# Patient Record
Sex: Male | Born: 1950 | Hispanic: Yes | Marital: Married | Smoking: Former smoker
Health system: Southern US, Community
[De-identification: ages and names within clinical notes are randomized; demographics above are authoritative.]

## PROBLEM LIST (undated history)

## (undated) DIAGNOSIS — M1990 Unspecified osteoarthritis, unspecified site: Secondary | ICD-10-CM

## (undated) DIAGNOSIS — E785 Hyperlipidemia, unspecified: Secondary | ICD-10-CM

## (undated) HISTORY — PX: KNEE SURGERY: SHX244

## (undated) HISTORY — DX: Hyperlipidemia, unspecified: E78.5

---

## 1978-07-01 HISTORY — PX: HEMORROIDECTOMY: SUR656

## 2014-01-19 ENCOUNTER — Telehealth: Payer: Self-pay | Admitting: Internal Medicine

## 2014-01-19 NOTE — Telephone Encounter (Signed)
Closed encounter °

## 2014-03-03 ENCOUNTER — Encounter: Payer: Self-pay | Admitting: Internal Medicine

## 2014-03-03 ENCOUNTER — Ambulatory Visit (INDEPENDENT_AMBULATORY_CARE_PROVIDER_SITE_OTHER): Payer: 59 | Admitting: Internal Medicine

## 2014-03-03 VITALS — BP 124/80 | HR 65 | Ht 70.0 in | Wt 192.1 lb

## 2014-03-03 DIAGNOSIS — E785 Hyperlipidemia, unspecified: Secondary | ICD-10-CM | POA: Insufficient documentation

## 2014-03-03 DIAGNOSIS — Z131 Encounter for screening for diabetes mellitus: Secondary | ICD-10-CM

## 2014-03-03 DIAGNOSIS — M5430 Sciatica, unspecified side: Secondary | ICD-10-CM | POA: Insufficient documentation

## 2014-03-03 DIAGNOSIS — R39198 Other difficulties with micturition: Secondary | ICD-10-CM

## 2014-03-03 DIAGNOSIS — Z79899 Other long term (current) drug therapy: Secondary | ICD-10-CM

## 2014-03-03 DIAGNOSIS — E782 Mixed hyperlipidemia: Secondary | ICD-10-CM

## 2014-03-03 DIAGNOSIS — Z8249 Family history of ischemic heart disease and other diseases of the circulatory system: Secondary | ICD-10-CM

## 2014-03-03 DIAGNOSIS — R3911 Hesitancy of micturition: Secondary | ICD-10-CM

## 2014-03-03 DIAGNOSIS — M5432 Sciatica, left side: Secondary | ICD-10-CM

## 2014-03-03 NOTE — Patient Instructions (Signed)
Dr Rennis Golden has ordered you to have blood work to be done FASTING.  Your physician wants you to follow-up in 6 months. You will receive a reminder letter in the mail one to two months in advance. If you don't receive a letter, please call our office to schedule the follow-up appointment.

## 2014-03-03 NOTE — Progress Notes (Signed)
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 He recently moved here full-time from [place]. He previously had a primary care provider there who put him on medication for his cholesterol. He does have a family history of premature coronary disease with his father who had coronary artery disease in his 69s and died of an MI. His mother had no significant heart disease. He's never had any prior history of coronary disease but has had stress tests in the past and has been told some of his symptoms are related to stress. He gets occasional sharp chest discomfort which goes away fairly quickly and does not sound anginal. He is actively exercising and swims walks or jogs and lifts weights 5-6 times a week. He is generally asymptomatic during this. He drinks a small amount of alcohol but is a nonsmoker.   PMHx:  Past Medical History  Diagnosis Date  . Hyperlipidemia     History reviewed. No pertinent past surgical history.  FAMHx:  Family History  Problem Relation Age of Onset  . Heart attack Father     SOCHx:   reports that he quit smoking about 6 months ago. His smoking use included Cigarettes. He has a 5 pack-year smoking history. He has never used smokeless tobacco. He reports that he drinks about 4 ounces of alcohol per week. He reports that he does not use illicit drugs.  ALLERGIES:  No Known Allergies  ROS: A comprehensive review of systems was negative except for: Musculoskeletal: positive for back pain and sciatica  HOME MEDS: Current Outpatient Prescriptions  Medication Sig Dispense Refill  . aspirin EC 81 MG tablet Take 81 mg by mouth daily.      Marland Kitchen atorvastatin (LIPITOR) 20 MG tablet Take 20 mg by mouth daily.       No current  facility-administered medications for this visit.    LABS/IMAGING: No results found for this or any previous visit (from the past 48 hour(s)). No results found.  VITALS: BP 124/80  Pulse 65  Ht  (1.778 m)  Wt 192 lb 1.6 oz (87.136 kg)  BMI 27.56 kg/m2  EXAM: General appearance: alert and no distress Neck: no carotid bruit, no JVD and thyroid not enlarged, symmetric, no tenderness/mass/nodules Lungs: clear to auscultation bilaterally Heart: regular rate and rhythm, S1, S2 normal, no murmur, click, rub or gallop Abdomen: soft, non-tender; bowel sounds normal; no masses,  no organomegaly Extremities: extremities normal, atraumatic, no cyanosis or edema Pulses: 2+ and symmetric Skin: Skin color, texture, turgor normal. No rashes or lesions Neurologic: Grossly normal Psych: Normal  EKG: Normal sinus rhythm at 65  ASSESSMENT: 1. Dyslipidemia 2. Family history of premature coronary disease 3. Sciatica  PLAN: 1.   Shaun Fleming is interested in establishing cardiac care base of his family history of heart disease as well as obtaining screening laboratory work and evaluating his generalized health. I would recommend a CBC, CMP, screening A1c, PSA and lipid profile. We will go ahead and renew his atorvastatin. It is reasonable for him to continue low-dose aspirin, based on his family history of coronary disease. He should continue his excellent exercise regimen. I will contact him with the results of his laboratory work. Have also provided him with the name of a primary care  provider in town which she may wish to establish. He is having some problems with low back pain and sciatica. He is contemplating possible back surgery in the future. He would likely need stress testing prior to this to determine his risk.  Plan to see him back in 6 months.  Chrystie Nose, MD, [facility] Attending Cardiologist [facility]  HILTY,Kenneth C 03/03/2014, 4:41 PM

## 2014-03-04 LAB — HEMOGLOBIN A1C
HEMOGLOBIN A1C: 5.6 % (ref <5.7)
Mean Plasma Glucose: 114 mg/dL (ref <117)

## 2014-03-04 LAB — CBC
HCT: 40.8 % (ref 39.0–52.0)
HEMOGLOBIN: 14.2 g/dL (ref 13.0–17.0)
MCH: 30.5 pg (ref 26.0–34.0)
MCHC: 34.8 g/dL (ref 30.0–36.0)
MCV: 87.6 fL (ref 78.0–100.0)
PLATELETS: 202 10*3/uL (ref 150–400)
RBC: 4.66 MIL/uL (ref 4.22–5.81)
RDW: 13.7 % (ref 11.5–15.5)
WBC: 9.1 10*3/uL (ref 4.0–10.5)

## 2014-03-05 LAB — COMPREHENSIVE METABOLIC PANEL
ALK PHOS: 66 U/L (ref 39–117)
ALT: 16 U/L (ref 0–53)
AST: 15 U/L (ref 0–37)
Albumin: 4.4 g/dL (ref 3.5–5.2)
BUN: 19 mg/dL (ref 6–23)
CO2: 28 meq/L (ref 19–32)
Calcium: 9.4 mg/dL (ref 8.4–10.5)
Chloride: 104 mEq/L (ref 96–112)
Creat: 1.07 mg/dL (ref 0.50–1.35)
GLUCOSE: 95 mg/dL (ref 70–99)
Potassium: 4.5 mEq/L (ref 3.5–5.3)
SODIUM: 141 meq/L (ref 135–145)
TOTAL PROTEIN: 6.8 g/dL (ref 6.0–8.3)
Total Bilirubin: 0.4 mg/dL (ref 0.2–1.2)

## 2014-03-05 LAB — PSA: PSA: 3.31 ng/mL (ref <=4.00)

## 2014-03-07 LAB — NMR LIPOPROFILE WITH LIPIDS
Cholesterol, Total: 170 mg/dL (ref 100–199)
HDL PARTICLE NUMBER: 37.8 umol/L (ref >=30.5)
HDL SIZE: 9.3 nm (ref >=9.2)
HDL-C: 56 mg/dL (ref >39)
LARGE HDL: 7.1 umol/L (ref >=4.8)
LARGE VLDL-P: 2.7 nmol/L (ref <=2.7)
LDL (calc): 97 mg/dL (ref 0–99)
LDL Particle Number: 1037 nmol/L — ABNORMAL HIGH (ref <1000)
LDL SIZE: 20.9 nm (ref >=20.8)
LP-IR Score: 44 (ref <=45)
Small LDL Particle Number: 462 nmol/L (ref <=527)
Triglycerides: 83 mg/dL (ref 0–149)
VLDL Size: 45.2 nm (ref <=46.6)

## 2014-03-08 ENCOUNTER — Encounter: Payer: Self-pay | Admitting: *Deleted

## 2014-04-06 ENCOUNTER — Other Ambulatory Visit: Payer: Self-pay | Admitting: *Deleted

## 2014-04-06 ENCOUNTER — Telehealth: Payer: Self-pay | Admitting: Internal Medicine

## 2014-04-06 MED ORDER — ATORVASTATIN CALCIUM 20 MG PO TABS: 20.0000 mg | ORAL_TABLET | Freq: Every day | ORAL | Status: DC

## 2014-04-06 NOTE — Telephone Encounter (Signed)
atorvastatin (LIPITOR) 20 MG tablet #30 tablet with 10 refills on 04/06/2014  Sig - Route: Take 1 tablet (20 mg total) by mouth daily. - Oral  E-Prescribing Status: Receipt confirmed by pharmacy (04/06/2014 10:10 AM EDT)

## 2014-04-06 NOTE — Telephone Encounter (Signed)
Please call his Atorvastatin 20 mg #30 for 6 months. He needs 6 months because [place] is going [place] the [place].Please call to [phone].

## 2014-04-06 NOTE — Telephone Encounter (Signed)
Rx was sent to pharmacy electronically. 

## 2014-04-29 ENCOUNTER — Other Ambulatory Visit: Payer: Self-pay

## 2014-04-29 MED ORDER — ATORVASTATIN CALCIUM 20 MG PO TABS: 20.0000 mg | ORAL_TABLET | Freq: Every day | ORAL | Status: DC

## 2014-04-29 NOTE — Telephone Encounter (Signed)
Rx sent to pharmacy   

## 2015-02-12 ENCOUNTER — Other Ambulatory Visit: Payer: Self-pay | Admitting: Internal Medicine

## 2015-02-13 NOTE — Telephone Encounter (Signed)
REFILL 

## 2015-03-26 ENCOUNTER — Encounter (HOSPITAL_COMMUNITY): Payer: Self-pay | Admitting: Emergency Medicine

## 2015-03-26 ENCOUNTER — Emergency Department (HOSPITAL_COMMUNITY)
Admission: EM | Admit: 2015-03-26 | Discharge: 2015-03-27 | Disposition: A | Discharge status: Home / Self Care | Payer: 59 | Attending: Emergency Medicine | Admitting: Emergency Medicine

## 2015-03-26 DIAGNOSIS — N390 Urinary tract infection, site not specified: Secondary | ICD-10-CM

## 2015-03-26 DIAGNOSIS — Z79899 Other long term (current) drug therapy: Secondary | ICD-10-CM | POA: Diagnosis not present

## 2015-03-26 DIAGNOSIS — Z7982 Long term (current) use of aspirin: Secondary | ICD-10-CM | POA: Diagnosis not present

## 2015-03-26 DIAGNOSIS — Z87891 Personal history of nicotine dependence: Secondary | ICD-10-CM | POA: Diagnosis not present

## 2015-03-26 DIAGNOSIS — E785 Hyperlipidemia, unspecified: Secondary | ICD-10-CM | POA: Diagnosis not present

## 2015-03-26 DIAGNOSIS — R509 Fever, unspecified: Secondary | ICD-10-CM | POA: Diagnosis present

## 2015-03-26 LAB — URINALYSIS, ROUTINE W REFLEX MICROSCOPIC
Bilirubin Urine: NEGATIVE
GLUCOSE, UA: NEGATIVE mg/dL
Ketones, ur: NEGATIVE mg/dL
Nitrite: NEGATIVE
PH: 8 (ref 5.0–8.0)
PROTEIN: 100 mg/dL — AB
SPECIFIC GRAVITY, URINE: 1.018 (ref 1.005–1.030)
Urobilinogen, UA: 1 mg/dL (ref 0.0–1.0)

## 2015-03-26 LAB — CBC WITH DIFFERENTIAL/PLATELET
BASOS ABS: 0 10*3/uL (ref 0.0–0.1)
Basophils Relative: 0 %
Eosinophils Absolute: 0 10*3/uL (ref 0.0–0.7)
Eosinophils Relative: 0 %
HEMATOCRIT: 43.3 % (ref 39.0–52.0)
Hemoglobin: 14.8 g/dL (ref 13.0–17.0)
LYMPHS PCT: 7 %
Lymphs Abs: 1.1 10*3/uL (ref 0.7–4.0)
MCH: 30.9 pg (ref 26.0–34.0)
MCHC: 34.2 g/dL (ref 30.0–36.0)
MCV: 90.4 fL (ref 78.0–100.0)
MONO ABS: 0.8 10*3/uL (ref 0.1–1.0)
Monocytes Relative: 5 %
NEUTROS ABS: 14.6 10*3/uL — AB (ref 1.7–7.7)
Neutrophils Relative %: 88 %
Platelets: 189 10*3/uL (ref 150–400)
RBC: 4.79 MIL/uL (ref 4.22–5.81)
RDW: 12.8 % (ref 11.5–15.5)
WBC: 16.5 10*3/uL — ABNORMAL HIGH (ref 4.0–10.5)

## 2015-03-26 LAB — COMPREHENSIVE METABOLIC PANEL
ALBUMIN: 4.7 g/dL (ref 3.5–5.0)
ALT: 20 U/L (ref 17–63)
AST: 23 U/L (ref 15–41)
Alkaline Phosphatase: 66 U/L (ref 38–126)
Anion gap: 6 (ref 5–15)
BUN: 19 mg/dL (ref 6–20)
CHLORIDE: 105 mmol/L (ref 101–111)
CO2: 28 mmol/L (ref 22–32)
Calcium: 9.2 mg/dL (ref 8.9–10.3)
Creatinine, Ser: 1.26 mg/dL — ABNORMAL HIGH (ref 0.61–1.24)
GFR calc Af Amer: >60 mL/min (ref >60)
GFR calc non Af Amer: 58 mL/min — ABNORMAL LOW (ref >60)
GLUCOSE: 104 mg/dL — AB (ref 65–99)
POTASSIUM: 4.4 mmol/L (ref 3.5–5.1)
Sodium: 139 mmol/L (ref 135–145)
Total Bilirubin: 0.5 mg/dL (ref 0.3–1.2)
Total Protein: 7.6 g/dL (ref 6.5–8.1)

## 2015-03-26 LAB — I-STAT CG4 LACTIC ACID, ED: Lactic Acid, Venous: 1.33 mmol/L (ref 0.5–2.0)

## 2015-03-26 LAB — URINE MICROSCOPIC-ADD ON

## 2015-03-26 MED ORDER — DEXTROSE 5 % IV SOLN
1.0000 g | Freq: Once | INTRAVENOUS | Status: AC
  Administered 2015-03-26: 1 g via INTRAVENOUS
  Filled 2015-03-26: qty 10

## 2015-03-26 MED ORDER — SODIUM CHLORIDE 0.9 % IV BOLUS (SEPSIS)
1000.0000 mL | Freq: Once | INTRAVENOUS | Status: AC
  Administered 2015-03-26: 1000 mL via INTRAVENOUS

## 2015-03-26 MED ORDER — ACETAMINOPHEN 325 MG PO TABS
650.0000 mg | ORAL_TABLET | Freq: Once | ORAL | Status: AC | PRN
  Administered 2015-03-26: 650 mg via ORAL
  Filled 2015-03-26: qty 2

## 2015-03-26 NOTE — ED Notes (Signed)
Delay in lab draw pt in bathroom 

## 2015-03-26 NOTE — ED Notes (Signed)
Pt states that he started having chills today, has had to urinate frequently and has a fever. Alert and oriented.

## 2015-03-26 NOTE — ED Provider Notes (Signed)
CSN: [id number]     Arrival date & time 03/26/15  2150 History   First MD Initiated Contact with Patient 03/26/15 2243     Chief Complaint  Patient presents with  . Polyuria  . Fever     (Consider location/radiation/quality/duration/timing/severity/associated sxs/prior Treatment) HPI Comments: Patient with no pertinent past medical history presents to the emergency department with chief complaint of dysuria and urinary frequency. He reports associated fevers, and chills that started today. He states that he just noticed some associated hematuria. He has not tried taking anything to alleviate his symptoms. There are no aggravating or alleviating factors. He has never had anything like this before. There are no radiating symptoms. Patient denies any abdominal pain.  The history is provided by the patient. No language interpreter was used.    Past Medical History  Diagnosis Date  . Hyperlipidemia    History reviewed. No pertinent past surgical history. Family History  Problem Relation Age of Onset  . Heart attack Father    Social History  Substance Use Topics  . Smoking status: Former Smoker -- 0.25 packs/day for 20 years    Types: Cigarettes    Quit date: 08/31/2013  . Smokeless tobacco: Never Used  . Alcohol Use: 4.0 oz/week    8 drink(s) per week    Review of Systems  Constitutional: Negative for fever and chills.  Respiratory: Negative for shortness of breath.   Cardiovascular: Negative for chest pain.  Gastrointestinal: Negative for nausea, vomiting, diarrhea and constipation.  Genitourinary: Positive for dysuria and hematuria.  All other systems reviewed and are negative.     Allergies  Review of patient's allergies indicates no known allergies.  Home Medications   Prior to Admission medications   Medication Sig Start Date End Date Taking? Authorizing Provider  aspirin EC 81 MG tablet Take 81 mg by mouth daily.    Historical Provider, MD  atorvastatin  (LIPITOR) 20 MG tablet Take 1 tablet (20 mg total) by mouth daily at 6 PM. NEED OV. 02/13/15   Chrystie Nose, MD   BP 145/73 mmHg  Pulse 122  Temp(Src) 101 F (38.3 C) (Oral)  Resp 15  Ht  (1.778 m)  Wt 182 lb (82.555 kg)  BMI 26.11 kg/m2  SpO2 100% Physical Exam  Constitutional: He is oriented to person, place, and time. He appears well-developed and well-nourished.  HENT:  Head: Normocephalic and atraumatic.  Eyes: Conjunctivae and EOM are normal. Pupils are equal, round, and reactive to light. Right eye exhibits no discharge. Left eye exhibits no discharge. No scleral icterus.  Neck: Normal range of motion. Neck supple. No JVD present.  Cardiovascular: Normal rate, regular rhythm and normal heart sounds.  Exam reveals no gallop and no friction rub.   No murmur heard. Pulmonary/Chest: Effort normal and breath sounds normal. No respiratory distress. He has no wheezes. He has no rales. He exhibits no tenderness.  Abdominal: Soft. He exhibits no distension and no mass. There is tenderness. There is no rebound and no guarding.  Mild suprapubic tenderness, no other focal abdominal tenderness  Musculoskeletal: Normal range of motion. He exhibits no edema or tenderness.  Neurological: He is alert and oriented to person, place, and time.  Skin: Skin is warm and dry.  Psychiatric: He has a normal mood and affect. His behavior is normal. Judgment and thought content normal.  Nursing note and vitals reviewed.   ED Course  Procedures (including critical care time) Results for orders placed or performed during  the hospital encounter of 03/26/15  Comprehensive metabolic panel  Result Value Ref Range   Sodium 139 135 - 145 mmol/L   Potassium 4.4 3.5 - 5.1 mmol/L   Chloride 105 101 - 111 mmol/L   CO2 28 22 - 32 mmol/L   Glucose, Bld 104 (H) 65 - 99 mg/dL   BUN 19 6 - 20 mg/dL   Creatinine, Ser 1.61 (H) 0.61 - 1.24 mg/dL   Calcium 9.2 8.9 - 09.6 mg/dL   Total Protein 7.6 6.5 - 8.1  g/dL   Albumin 4.7 3.5 - 5.0 g/dL   AST 23 15 - 41 U/L   ALT 20 17 - 63 U/L   Alkaline Phosphatase 66 38 - 126 U/L   Total Bilirubin 0.5 0.3 - 1.2 mg/dL   GFR calc non Af Amer 58 (L) >60 mL/min   GFR calc Af Amer >60 >60 mL/min   Anion gap 6 5 - 15  Urinalysis, Routine w reflex microscopic (not at [facility])  Result Value Ref Range   Color, Urine YELLOW YELLOW   APPearance TURBID (A) CLEAR   Specific Gravity, Urine 1.018 1.005 - 1.030   pH 8.0 5.0 - 8.0   Glucose, UA NEGATIVE NEGATIVE mg/dL   Hgb urine dipstick LARGE (A) NEGATIVE   Bilirubin Urine NEGATIVE NEGATIVE   Ketones, ur NEGATIVE NEGATIVE mg/dL   Protein, ur 045 (A) NEGATIVE mg/dL   Urobilinogen, UA 1.0 0.0 - 1.0 mg/dL   Nitrite NEGATIVE NEGATIVE   Leukocytes, UA LARGE (A) NEGATIVE  CBC with Differential  Result Value Ref Range   WBC 16.5 (H) 4.0 - 10.5 K/uL   RBC 4.79 4.22 - 5.81 MIL/uL   Hemoglobin 14.8 13.0 - 17.0 g/dL   HCT 40.9 81.1 - 91.4 %   MCV 90.4 78.0 - 100.0 fL   MCH 30.9 26.0 - 34.0 pg   MCHC 34.2 30.0 - 36.0 g/dL   RDW 78.2 95.6 - 21.3 %   Platelets 189 150 - 400 K/uL   Neutrophils Relative % 88 %   Neutro Abs 14.6 (H) 1.7 - 7.7 K/uL   Lymphocytes Relative 7 %   Lymphs Abs 1.1 0.7 - 4.0 K/uL   Monocytes Relative 5 %   Monocytes Absolute 0.8 0.1 - 1.0 K/uL   Eosinophils Relative 0 %   Eosinophils Absolute 0.0 0.0 - 0.7 K/uL   Basophils Relative 0 %   Basophils Absolute 0.0 0.0 - 0.1 K/uL  Urine microscopic-add on  Result Value Ref Range   Urine-Other FIELD OBSCURED BY RBC'S   I-Stat CG4 Lactic Acid, ED  (not at [facility])  Result Value Ref Range   Lactic Acid, Venous 1.33 0.5 - 2.0 mmol/L   No results found.    MDM   Final diagnoses:  UTI (lower urinary tract infection)    Patient with dysuria, hematuria, fevers, chills, and frequency.  Symptoms consistent with UTI, possible early pyelo.  Will treat with fluids and rocephin.  Urine culture pending.  No other pertinent past medical  problems.  Laboratory tests remarkable for leukocytosis to 16.5, creatinine is mildly elevated at 1.26. Lactic acid is normal at 1.33. Urinalysis is remarkable for large amount of hemoglobin and large leukocytes. Microscopic exam is limited because the field is obscured by RBCs. Patient's symptoms however consistent with urinary tract infection. Will treat with Rocephin and fluids in the emergency department.  Patient is feeling much better. Will discharge to home. Vital signs are stable. Will discharge with Keflex. Urine culture pending.  Specific return precautions given. Patient understands and agrees with plan.  Patient discussed with Dr. Rhunette Croft.    Roxy Horseman, PA-C 03/27/15 0030  Derwood Kaplan, MD 03/28/15 [phone]

## 2015-03-27 MED ORDER — CEPHALEXIN 500 MG PO CAPS: 500.0000 mg | ORAL_CAPSULE | Freq: Four times a day (QID) | ORAL | Status: DC

## 2015-03-27 NOTE — Discharge Instructions (Signed)

## 2015-03-29 LAB — URINE CULTURE

## 2015-03-30 ENCOUNTER — Telehealth (HOSPITAL_BASED_OUTPATIENT_CLINIC_OR_DEPARTMENT_OTHER): Payer: Self-pay | Admitting: Emergency Medicine

## 2015-03-30 NOTE — Telephone Encounter (Signed)
Post ED Visit - Positive Culture Follow-up  Culture report reviewed by antimicrobial stewardship pharmacist:   Celedonio Miyamoto, Pharm.D., BCPS  Georgina Pillion, Pharm.D., BCPS  [place], [place].D., BCPS, AAHIVP  Estella Husk, Pharm.D., BCPS, AAHIVP  [employer], [address].D.  Cassie Roseanne Ren[place], [place].D.  Positive urine culture E. coli Treated with cephalexin, organism sensitive to the same and no further patient follow-up is required at this time.  Berle Mull 03/30/2015, 10:01 AM

## 2015-05-02 ENCOUNTER — Other Ambulatory Visit (HOSPITAL_COMMUNITY): Payer: Self-pay | Admitting: Neurosurgery

## 2015-05-21 ENCOUNTER — Encounter (HOSPITAL_COMMUNITY): Payer: Self-pay | Admitting: Neurosurgery

## 2015-05-21 NOTE — H&P (Signed)
Patient ID:   [id number] Patient: Shaun Fleming  Date of Birth: February 18, 1951 Visit Type: Office Visit   Date: 04/27/2015 02:00 PM Provider: Marchia Meiers. Vertell Limber MD   This 64 year old male presents for back pain.  History of Present Illness: 1.  back pain  I met with the patient and his wife and reviewed his imaging findings with them.  I reviewed scoliosis radiographs.  There is coronal malalignment with a Cobb angle of 20 but he does not appear to have significant deviation from acceptable sagittal balance.  Given these findings and his complaints of radiculopathy I recommended proceeding with L3 L4, L4-L5, L5-S1 decompression and fusion surgery.  There is some coronal malalignment at L1-2 and L2-3 levels but these are not causing significant nerve root compression and he is not complaining of a lot of back pain.  I told him I did not think that surgery.  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  I have therefore recommended proceeding with decompression and fusion L3 through S1 levels.  We discussed the surgery in detail and then went over specific expectations and surgical models.  Aaron Edelman answered his questions and 50 him for LSO brace.  We're planning on scheduling surgery in the latter part of November.      Medical/Surgical/Interim History Reviewed, no change.  Last detailed document date:04/13/2015.   PAST MEDICAL HISTORY, SURGICAL HISTORY, FAMILY HISTORY, SOCIAL HISTORY AND REVIEW OF SYSTEMS I have reviewed the patient's past medical, surgical, family and social history as well as the comprehensive review of systems as included on the [facility] history form dated 04/13/2015, which I have signed.  Family History: Reviewed, no changes.  Last detailed document: 04/13/2015.   Social History: Tobacco use reviewed. Reviewed, no changes. Last detailed document date: 04/13/2015.       MEDICATIONS(added, continued or stopped this visit): Started Medication Directions Instruction Stopped   aspirin 81 mg tablet,delayed release take 1 tablet by oral route  every day     atorvastatin 20 mg tablet take 1 tablet by oral route  every day       ALLERGIES: Ingredient Reaction Medication Name Comment  NO KNOWN ALLERGIES     No known allergies.    Vitals Date Temp F BP Pulse Ht In Wt Lb BMI BSA Pain Score  04/27/2015  132/89 60 70 188 26.97  2/10      IMPRESSION The patient continues to have significant left greater than right lower extremity weakness and pain and back pain as well.  He is not responding to conservative management.  He wishes to proceed with surgery.  This will consist of L3 through S1 decompression and fusion surgery.  Completed Orders (this encounter) Order Details Reason Side Interpretation Result Initial Treatment Date Region  Scoliosis- AP/Lat      04/27/2015    Assessment/Plan # Detail Type Description   1. Assessment Spinal stenosis of lumbosacral region (M48.07).       2. Assessment Lumbago with sciatica, left side (M54.42).       3. Assessment Spondylolisthesis at L3-L4 level (M43.16).       4. Assessment Scoliosis (and kyphoscoliosis), idiopathic (M41.20).       5. Assessment Spinal stenosis, lumbar region (M48.06).         Pain Assessment/Treatment Pain Scale: 2/10. Method: Numeric Pain Intensity Scale. Location: back. Onset: 11/29/2009. Duration: varies. Quality: discomforting. Pain Assessment/Treatment follow-up plan of care: Patient alternating  heat and ice..  Fall Risk Plan The patient has not fallen in the last year.  Surgery is being scheduled in the latter part of November.  The patient was fitted for now also brace today.  We answered his questions and went over teaching in great detail.  Orders: Diagnostic Procedures: Assessment Procedure  M48.06 MAS PLIF  - L3-L4 - L4-L5 - L5-S1  M48.06 Scoliosis-  AP/Lat             Provider:  Marchia Meiers. Vertell Limber MD  04/29/2015 04:17 PM Dictation edited by: Marchia Meiers. [facility]    CC Providers: Velna Hatchet [address] [place], [place] [address]              Electronically signed by Marchia Meiers. Vertell Limber MD on 04/29/2015 04:17 PM  Patient ID:   [id number] Patient: Shaun Fleming  Date of Birth: 10-24-1950 Visit Type: Office Visit   Date: 04/13/2015 02:45 PM Provider: Marchia Meiers. Vertell Limber MD   This 64 year old male presents for back pain.  History of Present Illness: 1.  back pain  The patient comes in today for evaluation of left leg pain and increasingly worsening pain and weakness involving his left leg.  He now says that he cannot stand or walk for more than 100 feet and he complains of severe sciatica on the left leg.  Previous MRI of his lumbar spine was done about a year ago and he feels that his symptoms have progressed significantly.  His family had requested that he see me.  He had previously seen Dr. Kathyrn Sheriff.  Dr. Kathyrn Sheriff had recommended a lumbar decompression and fusion surgery involving the L3 L4 and L4 L5 levels because of spondylolisthesis and severe stenosis at these levels.  On my review of the MRI of the lumbar spine I am struck by the amount of foraminal stenosis at the L5-S1 level on the left.  I do think this is significant and I think this also plays into his left leg complaints which involve radiation into the top of his left foot.  On examination today he has extensor hallucis longus weakness at 4 out of 5 and also left hip abductor weakness at 4 out of 5.  He has decreased pain sensation in the left L5 distribution and positive straight leg raise on the left.  He is left sciatic notch discomfort to palpation.        PAST MEDICAL/SURGICAL HISTORY   (Detailed)  Disease/disorder Onset Date Management Date Comments    Arthroscopy knee  CRR 12/19/2014 - Left knee  Arthritis      High cholesterol    CRR  12/19/2014 -     PAST MEDICAL HISTORY, SURGICAL HISTORY, FAMILY HISTORY, SOCIAL HISTORY AND REVIEW OF SYSTEMS I have reviewed the patient's past medical, surgical, family and social history as well as the comprehensive review of systems as included on the [facility] history form dated 04/13/2015, which I have signed.  Family History  (Detailed) Patient reports there is no relevant family history.    SOCIAL HISTORY  (Detailed) Tobacco use reviewed. Preferred language is Unknown.   Smoking status: Never smoker.  SMOKING STATUS Use Status Type Smoking Status Usage Per Day Years Used Total Pack Years  no/never  Never smoker       HOME ENVIRONMENT/SAFETY The patient has not fallen in the last year.        MEDICATIONS(added, continued or stopped this visit): Started Medication Directions Instruction Stopped   aspirin  81 mg tablet,delayed release take 1 tablet by oral route  every day     atorvastatin 20 mg tablet take 1 tablet by oral route  every day       ALLERGIES: Ingredient Reaction Medication Name Comment  NO KNOWN ALLERGIES     No known allergies.    Vitals Date Temp F BP Pulse Ht In Wt Lb BMI BSA Pain Score  04/13/2015  122/76 76 70 183 26.26  4/10      IMPRESSION At this point the patient's symptoms have progressed and he has developed increasing left leg weakness.  I do think that further evaluation with MR imaging needs to be performed to make sure that there is not been progression of degenerative changes and spondylolisthesis as well as significant foraminal stenosis on the left at the L5-S1 level.  To this and I recommended a repeat lumbar radiographs and repeat lumbar MRI.  Completed Orders (this encounter) Order Details Reason Side Interpretation Result Initial Treatment Date Region  Lumbar Spine- AP/Lat/Flex/Ex      04/13/2015   Lifestyle education regarding diet Encouraged to eat a well balanced diet and follow up with  primary care physician.         Assessment/Plan # Detail Type Description   1. Assessment Spondylolisthesis at L3-L4 level (M43.16).       2. Assessment Spinal stenosis of lumbar region (M48.06).       3. Assessment Spinal stenosis of lumbosacral region (M48.07).       4. Assessment Lumbago with sciatica, left side (M54.42).       5. Assessment Body mass index (BMI) 26.0-26.9, adult (W97.94).   Plan Orders Today's instructions / counseling include(s) Lifestyle education regarding diet.         Pain Assessment/Treatment Pain Scale: 4/10. Method: Numeric Pain Intensity Scale. Location: back. Onset: 11/29/2009. Duration: varies. Quality: discomforting. Pain Assessment/Treatment follow-up plan of care: Patient is taking medications as prescribed..  Fall Risk Plan The patient has not fallen in the last year.  I do think that the patient will require surgery although I'm not certain at this point of the specific levels.  He may need attention to the L5-S1 level if indeed it appears that his left L5 nerve root is getting significant compression within the foramen due to stenosis and spondylolisthesis as well as mild scoliosis.  I agree with the L3 L4 and L4-L5 levels are also significantly affected.  The patient has gone through considerable conservative management without relief.  He is concerned about his progressive weakness.  Orders: Diagnostic Procedures: Assessment Procedure  M54.42 Lumbar Spine- AP/Lat/Flex/Ex  M54.42 MRI Spine/lumb W/o Contrast  Instruction(s)/Education: Assessment Instruction  Z68.26 Lifestyle education regarding diet             Provider:  Marchia Meiers. Vertell Limber MD  04/13/2015 05:02 PM Dictation edited by: Marchia Meiers. [facility]    CC Providers: Velna Hatchet [address] [place], [place] [address]              Electronically signed by Marchia Meiers. Vertell Limber MD on 04/13/2015 05:02 PM

## 2015-05-23 ENCOUNTER — Encounter (HOSPITAL_COMMUNITY): Payer: Self-pay

## 2015-05-23 ENCOUNTER — Telehealth: Payer: Self-pay | Admitting: Internal Medicine

## 2015-05-23 ENCOUNTER — Encounter (HOSPITAL_COMMUNITY)
Admission: RE | Admit: 2015-05-23 | Discharge: 2015-05-23 | Disposition: A | Discharge status: Home / Self Care | Payer: 59 | Source: Ambulatory Visit | Attending: Neurosurgery | Admitting: Neurosurgery

## 2015-05-23 DIAGNOSIS — Z79899 Other long term (current) drug therapy: Secondary | ICD-10-CM | POA: Insufficient documentation

## 2015-05-23 DIAGNOSIS — Z01812 Encounter for preprocedural laboratory examination: Secondary | ICD-10-CM | POA: Diagnosis not present

## 2015-05-23 DIAGNOSIS — Z0183 Encounter for blood typing: Secondary | ICD-10-CM | POA: Insufficient documentation

## 2015-05-23 DIAGNOSIS — Z01818 Encounter for other preprocedural examination: Secondary | ICD-10-CM | POA: Insufficient documentation

## 2015-05-23 DIAGNOSIS — Z7982 Long term (current) use of aspirin: Secondary | ICD-10-CM | POA: Insufficient documentation

## 2015-05-23 DIAGNOSIS — E785 Hyperlipidemia, unspecified: Secondary | ICD-10-CM | POA: Insufficient documentation

## 2015-05-23 DIAGNOSIS — Z87891 Personal history of nicotine dependence: Secondary | ICD-10-CM | POA: Diagnosis not present

## 2015-05-23 HISTORY — DX: Unspecified osteoarthritis, unspecified site: M19.90

## 2015-05-23 LAB — TYPE AND SCREEN
ABO/RH(D): A POS
ANTIBODY SCREEN: NEGATIVE

## 2015-05-23 LAB — ABO/RH: ABO/RH(D): A POS

## 2015-05-23 LAB — BASIC METABOLIC PANEL
ANION GAP: 7 (ref 5–15)
BUN: 12 mg/dL (ref 6–20)
CALCIUM: 9.1 mg/dL (ref 8.9–10.3)
CO2: 26 mmol/L (ref 22–32)
Chloride: 106 mmol/L (ref 101–111)
Creatinine, Ser: 1.2 mg/dL (ref 0.61–1.24)
Glucose, Bld: 102 mg/dL — ABNORMAL HIGH (ref 65–99)
Potassium: 4.2 mmol/L (ref 3.5–5.1)
SODIUM: 139 mmol/L (ref 135–145)

## 2015-05-23 LAB — URINALYSIS, ROUTINE W REFLEX MICROSCOPIC
BILIRUBIN URINE: NEGATIVE
GLUCOSE, UA: NEGATIVE mg/dL
HGB URINE DIPSTICK: NEGATIVE
KETONES UR: NEGATIVE mg/dL
Leukocytes, UA: NEGATIVE
Nitrite: NEGATIVE
PROTEIN: NEGATIVE mg/dL
Specific Gravity, Urine: 1.016 (ref 1.005–1.030)
pH: 7.5 (ref 5.0–8.0)

## 2015-05-23 LAB — CBC
HCT: 39.1 % (ref 39.0–52.0)
HEMOGLOBIN: 13.2 g/dL (ref 13.0–17.0)
MCH: 30.3 pg (ref 26.0–34.0)
MCHC: 33.8 g/dL (ref 30.0–36.0)
MCV: 89.7 fL (ref 78.0–100.0)
Platelets: 192 10*3/uL (ref 150–400)
RBC: 4.36 MIL/uL (ref 4.22–5.81)
RDW: 13.2 % (ref 11.5–15.5)
WBC: 6.1 10*3/uL (ref 4.0–10.5)

## 2015-05-23 LAB — SURGICAL PCR SCREEN
MRSA, PCR: NEGATIVE
Staphylococcus aureus: NEGATIVE

## 2015-05-23 NOTE — Pre-Procedure Instructions (Signed)
    Shaun Fleming  05/23/2015      Your procedure is scheduled on Tuesday, November 29..  Report to [facility] Admitting at 10:00 A.M.               Your procedure is scheduled for 12:00 noon   Call this number if you have problems the morning of surgery:[phone]                For any other questions, please call [phone], Monday - Friday 8 AM - 4 PM.   Remember:  Do not eat food or drink liquids after midnight Monday, November 28.  Take these medicines the morning of surgery with A SIP OF WATER : None   Do not wear jewelry, make-up or nail polish.   Do not wear lotions, powders, or perfumes.     Men may shave face and neck.   Do not bring valuables to the hospital.   [facility] is not responsible for any belongings or valuables.  Contacts, dentures or bridgework may not be worn into surgery.  Leave your suitcase in the car.  After surgery it may be brought to your room.  For patients admitted to the hospital, discharge time will be determined by your treatment team.  Special instructions:  Review  [facility] - Preparing For Surgery.  Please read over the following fact sheets that you were given. Pain Booklet, Coughing and Deep Breathing, Blood Transfusion Information and Surgical Site Infection Prevention

## 2015-05-23 NOTE — Progress Notes (Signed)
Anesthesia Note: Patient is a 64 year old male scheduled for L3-4, L4-5, L5-S1 MAS PLIF on 05/30/15 by Dr. Venetia MaxonStern.   History includes former smoker, HLD. Question family history of premature CAD. He says father had chest pain in his 4350's, but definite CAD/MI. [place] working [place] then, and his parents were in [place]. He says his father never underwent any intervention and died in his 7580's of "natural causes". His mother died at age 66 also from natural causes.    [place]. 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   PCP is Dr. Christiane HaSharon Wolter with [facility]. Last CPE with EKG on 04/10/15.      [place], he saw cardiologist Dr. Zoila ShutterKenneth Hilty in 03/2014. He had started his new job that was very stressful and just wanted to get checked out. Per Dr. Blanchie DessertHilty's note, patient had prior stress tests and was told symptoms were related to stress. Patient tells me prior stress test was done as part of a "routine check up" and were okay. At that visit, he did report occasional sharp chest pains that Dr. Rennis GoldenHilty did not think were anginal. He was contemplating possible back surgery, and Dr. Rennis GoldenHilty felt patient "would likely need stress testing prior to this to determine his risk.." Patient says that since then, he has no further sharp or other type of chest pains. No SOB, new edema. He stays very active at work and goes to the gym "nearly every day." [facility] for 1-1 hr 15 min. He does 45 minutes of cardio (bike-level 7 for 10 minutes and then advances to 02-06-09). He does not get chest pains with this level of activity.   Meds list includes ASA 81 mg, Lipitor.  PAT Vitals: BP 127/80, HR 65, RR 20, T 36.4, O2 sat 99%. Heart RRR, no murmur noted. Lungs clear. No carotid bruits noted. Good mouth opening. No significant LE edema.  04/10/15 EKG  (Dr. Paulino RilyWolters): NSR, RSR prime in V1, non-diagnostic. I and Dr. Aleene DavidsonE. Fitzgerald reviewed tracing and both felt it was not significantly changed when compared to prior tracing from 03/02/14.  Preoperative labs noted. CBC, BMET (glucose 102 but non-fasting), UA WNL.   I reviewed above with anesthesiologist Dr. Aleene DavidsonE. Fitzgerald. Recommend getting preoperative cardiology input since Dr. Rennis GoldenHilty was considering pre-operative stress test at his visit last year. I called and spoke with Dr. Rennis GoldenHilty. We discussed patient's plans for lumbar fusion, his current exercise tolerance, denial of CV symptoms. (At that time, we were still awaiting EKG from PCP.) We discussed whether patient would need to be seen pre-operatively by cardiology versus getting a pre-operative stress test versus proceeding as planned since patient has no CV symptoms and METS > 4. He felt that if the EKG was stable then patient, "could likely have surgery with an acceptable risk and no further testing. I'm happy to see him as needed during the hospitalization." As above, I and Dr. Sampson GoonFitzgerald felt his EKG was stable. If no acute changes then would plan to proceed.   Velna Ochsllison Roper Tolson, PA-C [facility]/Anesthesiology Phone [phone] 05/23/2015 5:14 PM

## 2015-05-23 NOTE — Progress Notes (Signed)
Dr Rennis GoldenHilty notes, 03/03/14, "He is having some problems with low back pain and sciatica. He is contemplating possible back surgery in the future. He would likely need stress testing prior to this to determine his risk."  Also requested to see Patient in 6 months.  I do not see that he has seen Dr Rennis GoldenHilty since 03/04/15. I also noted that patient was treated in ED 03/2105 for UTI- E-coli.  I called and gave this information to Shanda BumpsJessica at Dr Fredrich BirksStern's office.  Shanda BumpsJessica said she will send clearance note to Dr Rennis GoldenHilty and we will do a urinalysis at PAT appointment.

## 2015-05-23 NOTE — Telephone Encounter (Signed)
Called by Shonna ChockAllison Zelenak, PA-C with anesthesia pre-op. Mr. Shaun Fleming is scheduled to have back surgery soon. When I saw him last in 2015, he was having some chest pains (albeit atypical) and given his family history of CAD and lack of significant exertion, I mentioned it may be helpful to consider stress testing prior to surgery. At this point, he reports that he is more active, denies any chest pain and is ready to have back surgery. I would certainly review a repeat EKG - if there are no changes, he could likely have surgery with an acceptable risk and no further testing. I'm happy to see him as needed during the hospitalization.  Thanks for notifying me.  Chrystie NoseKenneth C. Izel Hochberg, MD, [facility] Attending Cardiologist [facility]

## 2015-05-29 MED ORDER — CEFAZOLIN SODIUM-DEXTROSE 2-3 GM-% IV SOLR
2.0000 g | INTRAVENOUS | Status: AC
  Administered 2015-05-30 (×2): 2 g via INTRAVENOUS
  Filled 2015-05-29: qty 50

## 2015-05-30 ENCOUNTER — Encounter (HOSPITAL_COMMUNITY)
Admission: AD | Disposition: A | Discharge status: Home / Self Care | Payer: Medicare Other | Source: Ambulatory Visit | Attending: Neurosurgery

## 2015-05-30 ENCOUNTER — Inpatient Hospital Stay (HOSPITAL_COMMUNITY)
Admission: AD | Admit: 2015-05-30 | Discharge: 2015-06-01 | DRG: 458 | Disposition: A | Discharge status: Home / Self Care | Payer: 59 | Source: Ambulatory Visit | Attending: Neurosurgery | Admitting: Neurosurgery

## 2015-05-30 ENCOUNTER — Inpatient Hospital Stay (HOSPITAL_COMMUNITY): Payer: 59

## 2015-05-30 ENCOUNTER — Inpatient Hospital Stay (HOSPITAL_COMMUNITY): Payer: 59 | Admitting: Vascular Surgery

## 2015-05-30 ENCOUNTER — Encounter (HOSPITAL_COMMUNITY): Payer: Self-pay | Admitting: Anesthesiology

## 2015-05-30 ENCOUNTER — Inpatient Hospital Stay (HOSPITAL_COMMUNITY): Payer: 59 | Admitting: Anesthesiology

## 2015-05-30 DIAGNOSIS — Z419 Encounter for procedure for purposes other than remedying health state, unspecified: Secondary | ICD-10-CM

## 2015-05-30 DIAGNOSIS — M4807 Spinal stenosis, lumbosacral region: Secondary | ICD-10-CM | POA: Diagnosis present

## 2015-05-30 DIAGNOSIS — M4126 Other idiopathic scoliosis, lumbar region: Principal | ICD-10-CM | POA: Diagnosis present

## 2015-05-30 DIAGNOSIS — M4316 Spondylolisthesis, lumbar region: Secondary | ICD-10-CM | POA: Diagnosis present

## 2015-05-30 DIAGNOSIS — Z7982 Long term (current) use of aspirin: Secondary | ICD-10-CM | POA: Diagnosis not present

## 2015-05-30 DIAGNOSIS — M549 Dorsalgia, unspecified: Secondary | ICD-10-CM | POA: Diagnosis present

## 2015-05-30 DIAGNOSIS — E7800 Pure hypercholesterolemia, unspecified: Secondary | ICD-10-CM | POA: Diagnosis present

## 2015-05-30 DIAGNOSIS — Z79899 Other long term (current) drug therapy: Secondary | ICD-10-CM

## 2015-05-30 DIAGNOSIS — M5410 Radiculopathy, site unspecified: Secondary | ICD-10-CM | POA: Diagnosis present

## 2015-05-30 DIAGNOSIS — M4806 Spinal stenosis, lumbar region: Secondary | ICD-10-CM | POA: Diagnosis present

## 2015-05-30 DIAGNOSIS — M5442 Lumbago with sciatica, left side: Secondary | ICD-10-CM | POA: Diagnosis present

## 2015-05-30 DIAGNOSIS — M1990 Unspecified osteoarthritis, unspecified site: Secondary | ICD-10-CM | POA: Diagnosis present

## 2015-05-30 DIAGNOSIS — M419 Scoliosis, unspecified: Secondary | ICD-10-CM | POA: Diagnosis present

## 2015-05-30 HISTORY — PX: MAXIMUM ACCESS (MAS)POSTERIOR LUMBAR INTERBODY FUSION (PLIF) 3 LEVEL: SHX6370

## 2015-05-30 SURGERY — FOR MAXIMUM ACCESS (MAS) POSTERIOR LUMBAR INTERBODY FUSION (PLIF) 3 LEVEL
Anesthesia: General | Site: Spine Lumbar

## 2015-05-30 MED ORDER — PANTOPRAZOLE SODIUM 40 MG IV SOLR
40.0000 mg | Freq: Every day | INTRAVENOUS | Status: DC
  Administered 2015-05-30: 40 mg via INTRAVENOUS
  Filled 2015-05-30: qty 40

## 2015-05-30 MED ORDER — OXYCODONE-ACETAMINOPHEN 5-325 MG PO TABS
1.0000 | ORAL_TABLET | ORAL | Status: DC | PRN
  Administered 2015-05-30: 2 via ORAL
  Administered 2015-05-30: 1 via ORAL
  Administered 2015-05-31 – 2015-06-01 (×7): 2 via ORAL
  Filled 2015-05-30 (×8): qty 2

## 2015-05-30 MED ORDER — MENTHOL 3 MG MT LOZG: 1.0000 | LOZENGE | OROMUCOSAL | Status: DC | PRN

## 2015-05-30 MED ORDER — BUPIVACAINE HCL (PF) 0.5 % IJ SOLN
INTRAMUSCULAR | Status: DC | PRN
Start: 2015-05-30 — End: 2015-05-30
  Administered 2015-05-30: 5 mL

## 2015-05-30 MED ORDER — GLYCOPYRROLATE 0.2 MG/ML IJ SOLN
INTRAMUSCULAR | Status: DC | PRN
  Administered 2015-05-30: 0.2 mg via INTRAVENOUS

## 2015-05-30 MED ORDER — MIDAZOLAM HCL 2 MG/2ML IJ SOLN
INTRAMUSCULAR | Status: AC
  Filled 2015-05-30: qty 2

## 2015-05-30 MED ORDER — ONDANSETRON HCL 4 MG/2ML IJ SOLN
INTRAMUSCULAR | Status: DC | PRN
  Administered 2015-05-30: 4 mg via INTRAVENOUS

## 2015-05-30 MED ORDER — LIDOCAINE HCL (CARDIAC) 20 MG/ML IV SOLN
INTRAVENOUS | Status: DC | PRN
  Administered 2015-05-30: 80 mg via INTRAVENOUS
  Administered 2015-05-30: 80 mg via INTRATRACHEAL

## 2015-05-30 MED ORDER — KCL IN DEXTROSE-NACL 20-5-0.45 MEQ/L-%-% IV SOLN
INTRAVENOUS | Status: DC
  Filled 2015-05-30 (×4): qty 1000

## 2015-05-30 MED ORDER — SUCCINYLCHOLINE CHLORIDE 20 MG/ML IJ SOLN
INTRAMUSCULAR | Status: DC | PRN
  Administered 2015-05-30: 200 mg via INTRAVENOUS

## 2015-05-30 MED ORDER — METHOCARBAMOL 500 MG PO TABS
500.0000 mg | ORAL_TABLET | Freq: Four times a day (QID) | ORAL | Status: DC | PRN
  Administered 2015-05-30 – 2015-06-01 (×6): 500 mg via ORAL
  Filled 2015-05-30 (×5): qty 1

## 2015-05-30 MED ORDER — CEFAZOLIN SODIUM-DEXTROSE 2-3 GM-% IV SOLR
2.0000 g | Freq: Three times a day (TID) | INTRAVENOUS | Status: AC
  Administered 2015-05-30 – 2015-05-31 (×2): 2 g via INTRAVENOUS
  Filled 2015-05-30 (×2): qty 50

## 2015-05-30 MED ORDER — ONDANSETRON HCL 4 MG/2ML IJ SOLN
INTRAMUSCULAR | Status: AC
  Filled 2015-05-30: qty 2

## 2015-05-30 MED ORDER — BUPIVACAINE LIPOSOME 1.3 % IJ SUSP
INTRAMUSCULAR | Status: DC | PRN
  Administered 2015-05-30: 20 mL

## 2015-05-30 MED ORDER — DEXAMETHASONE SODIUM PHOSPHATE 4 MG/ML IJ SOLN
INTRAMUSCULAR | Status: AC
  Filled 2015-05-30: qty 2

## 2015-05-30 MED ORDER — POLYETHYLENE GLYCOL 3350 17 G PO PACK: 17.0000 g | PACK | Freq: Every day | ORAL | Status: DC | PRN

## 2015-05-30 MED ORDER — SODIUM CHLORIDE 0.9 % IV SOLN
INTRAVENOUS | Status: DC | PRN
  Administered 2015-05-30: 16:00:00 via INTRAVENOUS

## 2015-05-30 MED ORDER — HYDROMORPHONE HCL 1 MG/ML IJ SOLN
0.2500 mg | INTRAMUSCULAR | Status: DC | PRN
  Administered 2015-05-30 (×3): 0.5 mg via INTRAVENOUS

## 2015-05-30 MED ORDER — ONDANSETRON HCL 4 MG/2ML IJ SOLN: 4.0000 mg | INTRAMUSCULAR | Status: DC | PRN

## 2015-05-30 MED ORDER — DEXAMETHASONE SODIUM PHOSPHATE 4 MG/ML IJ SOLN
INTRAMUSCULAR | Status: DC | PRN
  Administered 2015-05-30: 8 mg via INTRAVENOUS

## 2015-05-30 MED ORDER — HYDROMORPHONE HCL 1 MG/ML IJ SOLN
INTRAMUSCULAR | Status: AC
  Administered 2015-05-30: 0.5 mg via INTRAVENOUS
  Filled 2015-05-30: qty 1

## 2015-05-30 MED ORDER — ATORVASTATIN CALCIUM 20 MG PO TABS
10.0000 mg | ORAL_TABLET | Freq: Every day | ORAL | Status: DC
  Administered 2015-05-30 – 2015-05-31 (×2): 10 mg via ORAL
  Filled 2015-05-30 (×2): qty 1

## 2015-05-30 MED ORDER — CEFAZOLIN SODIUM-DEXTROSE 2-3 GM-% IV SOLR
INTRAVENOUS | Status: AC
  Filled 2015-05-30: qty 50

## 2015-05-30 MED ORDER — LIDOCAINE-EPINEPHRINE 1 %-1:100000 IJ SOLN
INTRAMUSCULAR | Status: DC | PRN
  Administered 2015-05-30: 5 mL

## 2015-05-30 MED ORDER — MEPERIDINE HCL 25 MG/ML IJ SOLN: 6.2500 mg | INTRAMUSCULAR | Status: DC | PRN

## 2015-05-30 MED ORDER — PHENOL 1.4 % MT LIQD: 1.0000 | OROMUCOSAL | Status: DC | PRN

## 2015-05-30 MED ORDER — MIDAZOLAM HCL 5 MG/5ML IJ SOLN
INTRAMUSCULAR | Status: DC | PRN
  Administered 2015-05-30: 2 mg via INTRAVENOUS

## 2015-05-30 MED ORDER — PHENYLEPHRINE HCL 10 MG/ML IJ SOLN
10.0000 mg | INTRAVENOUS | Status: DC | PRN
  Administered 2015-05-30: 20 ug/min via INTRAVENOUS

## 2015-05-30 MED ORDER — SODIUM CHLORIDE 0.9 % IJ SOLN: 3.0000 mL | INTRAMUSCULAR | Status: DC | PRN

## 2015-05-30 MED ORDER — HYDROCODONE-ACETAMINOPHEN 5-325 MG PO TABS: 1.0000 | ORAL_TABLET | ORAL | Status: DC | PRN

## 2015-05-30 MED ORDER — PHENYLEPHRINE HCL 10 MG/ML IJ SOLN
INTRAMUSCULAR | Status: DC | PRN
  Administered 2015-05-30 (×2): 80 ug via INTRAVENOUS

## 2015-05-30 MED ORDER — ALUM & MAG HYDROXIDE-SIMETH 200-200-20 MG/5ML PO SUSP: 30.0000 mL | Freq: Four times a day (QID) | ORAL | Status: DC | PRN

## 2015-05-30 MED ORDER — METHOCARBAMOL 1000 MG/10ML IJ SOLN
500.0000 mg | Freq: Four times a day (QID) | INTRAVENOUS | Status: DC | PRN
  Filled 2015-05-30: qty 5

## 2015-05-30 MED ORDER — OXYCODONE-ACETAMINOPHEN 5-325 MG PO TABS
ORAL_TABLET | ORAL | Status: AC
  Administered 2015-05-30: 1 via ORAL
  Filled 2015-05-30: qty 1

## 2015-05-30 MED ORDER — ADULT MULTIVITAMIN W/MINERALS CH
1.0000 | ORAL_TABLET | Freq: Every day | ORAL | Status: DC
  Administered 2015-05-30 – 2015-06-01 (×3): 1 via ORAL
  Filled 2015-05-30 (×3): qty 1

## 2015-05-30 MED ORDER — LACTATED RINGERS IV SOLN
INTRAVENOUS | Status: DC | PRN
  Administered 2015-05-30: 17:00:00 via INTRAVENOUS

## 2015-05-30 MED ORDER — FENTANYL CITRATE (PF) 250 MCG/5ML IJ SOLN
INTRAMUSCULAR | Status: AC
  Filled 2015-05-30: qty 5

## 2015-05-30 MED ORDER — ACETAMINOPHEN 650 MG RE SUPP: 650.0000 mg | RECTAL | Status: DC | PRN

## 2015-05-30 MED ORDER — ACETAMINOPHEN 325 MG PO TABS: 650.0000 mg | ORAL_TABLET | ORAL | Status: DC | PRN

## 2015-05-30 MED ORDER — THROMBIN 5000 UNITS EX SOLR
OROMUCOSAL | Status: DC | PRN
  Administered 2015-05-30: 16:00:00 via TOPICAL

## 2015-05-30 MED ORDER — METHOCARBAMOL 500 MG PO TABS
ORAL_TABLET | ORAL | Status: AC
  Administered 2015-05-30: 500 mg via ORAL
  Filled 2015-05-30: qty 1

## 2015-05-30 MED ORDER — 0.9 % SODIUM CHLORIDE (POUR BTL) OPTIME
TOPICAL | Status: DC | PRN
  Administered 2015-05-30: 1000 mL

## 2015-05-30 MED ORDER — FENTANYL CITRATE (PF) 100 MCG/2ML IJ SOLN
INTRAMUSCULAR | Status: DC | PRN
  Administered 2015-05-30: 50 ug via INTRAVENOUS
  Administered 2015-05-30: 250 ug via INTRAVENOUS
  Administered 2015-05-30: 50 ug via INTRAVENOUS

## 2015-05-30 MED ORDER — BUPIVACAINE LIPOSOME 1.3 % IJ SUSP
20.0000 mL | INTRAMUSCULAR | Status: DC
  Filled 2015-05-30: qty 20

## 2015-05-30 MED ORDER — BISACODYL 10 MG RE SUPP: 10.0000 mg | Freq: Every day | RECTAL | Status: DC | PRN

## 2015-05-30 MED ORDER — EPHEDRINE SULFATE 50 MG/ML IJ SOLN
INTRAMUSCULAR | Status: DC | PRN
  Administered 2015-05-30: 10 mg via INTRAVENOUS

## 2015-05-30 MED ORDER — ASPIRIN EC 81 MG PO TBEC
81.0000 mg | DELAYED_RELEASE_TABLET | Freq: Every evening | ORAL | Status: DC
  Administered 2015-05-31: 81 mg via ORAL
  Filled 2015-05-30: qty 1

## 2015-05-30 MED ORDER — FLEET ENEMA 7-19 GM/118ML RE ENEM: 1.0000 | ENEMA | Freq: Once | RECTAL | Status: DC | PRN

## 2015-05-30 MED ORDER — PROPOFOL 500 MG/50ML IV EMUL
INTRAVENOUS | Status: DC | PRN
  Administered 2015-05-30: 25 ug/kg/min via INTRAVENOUS

## 2015-05-30 MED ORDER — ONDANSETRON HCL 4 MG/2ML IJ SOLN: 4.0000 mg | Freq: Once | INTRAMUSCULAR | Status: DC | PRN

## 2015-05-30 MED ORDER — PROPOFOL 10 MG/ML IV BOLUS
INTRAVENOUS | Status: DC | PRN
  Administered 2015-05-30: 160 mg via INTRAVENOUS

## 2015-05-30 MED ORDER — SODIUM CHLORIDE 0.9 % IJ SOLN
3.0000 mL | Freq: Two times a day (BID) | INTRAMUSCULAR | Status: DC
  Administered 2015-05-30 – 2015-05-31 (×3): 3 mL via INTRAVENOUS

## 2015-05-30 MED ORDER — LACTATED RINGERS IV SOLN
INTRAVENOUS | Status: DC
  Administered 2015-05-30 (×4): via INTRAVENOUS

## 2015-05-30 MED ORDER — PROPOFOL 10 MG/ML IV BOLUS
INTRAVENOUS | Status: AC
  Filled 2015-05-30: qty 20

## 2015-05-30 MED ORDER — HYDROMORPHONE HCL 1 MG/ML IJ SOLN
0.5000 mg | INTRAMUSCULAR | Status: DC | PRN
  Administered 2015-05-30 – 2015-05-31 (×2): 1 mg via INTRAVENOUS
  Filled 2015-05-30 (×2): qty 1

## 2015-05-30 MED ORDER — SURGIFOAM 100 EX MISC
CUTANEOUS | Status: DC | PRN
  Administered 2015-05-30: 13:00:00 via TOPICAL

## 2015-05-30 MED ORDER — SODIUM CHLORIDE 0.9 % IV SOLN: 250.0000 mL | INTRAVENOUS | Status: DC

## 2015-05-30 MED ORDER — DOCUSATE SODIUM 100 MG PO CAPS
100.0000 mg | ORAL_CAPSULE | Freq: Two times a day (BID) | ORAL | Status: DC
Start: 2015-05-30 — End: 2015-06-01
  Administered 2015-05-30 – 2015-06-01 (×4): 100 mg via ORAL
  Filled 2015-05-30 (×4): qty 1

## 2015-05-30 MED ORDER — ALBUMIN HUMAN 5 % IV SOLN
INTRAVENOUS | Status: DC | PRN
  Administered 2015-05-30: 16:00:00 via INTRAVENOUS

## 2015-05-30 MED ORDER — LIDOCAINE HCL (CARDIAC) 20 MG/ML IV SOLN
INTRAVENOUS | Status: AC
  Filled 2015-05-30: qty 5

## 2015-05-30 SURGICAL SUPPLY — 90 items
BENZOIN TINCTURE PRP APPL 2/3 (GAUZE/BANDAGES/DRESSINGS) IMPLANT
BIT DRILL PLIF MAS DISP 5.5MM (DRILL) ×1 IMPLANT
BLADE CLIPPER SURG (BLADE) IMPLANT
BONE CANC CHIPS 40CC CAN1/2 (Bone Implant) ×3 IMPLANT
BUR MATCHSTICK NEURO 3.0 LAGG (BURR) ×3 IMPLANT
BUR PRECISION FLUTE 5.0 (BURR) ×3 IMPLANT
BUR ROUND FLUTED 5 RND (BURR) ×2 IMPLANT
BUR ROUND FLUTED 5MM RND (BURR) ×1
CAGE COROENT LG 10X9X23-12 (Cage) ×6 IMPLANT
CAGE COROENT MP 8X9X23M-8 SPIN (Cage) ×12 IMPLANT
CANISTER SUCT 3000ML PPV (MISCELLANEOUS) ×3 IMPLANT
CHIPS CANC BONE 40CC CAN1/2 (Bone Implant) ×1 IMPLANT
CLIP NEUROVISION LG (CLIP) ×3 IMPLANT
CLOSURE WOUND 1/2 X4 (GAUZE/BANDAGES/DRESSINGS) ×1
CONT SPEC 4OZ CLIKSEAL STRL BL (MISCELLANEOUS) ×3 IMPLANT
COVER BACK TABLE 24X17X13 BIG (DRAPES) IMPLANT
COVER BACK TABLE 60X90IN (DRAPES) ×3 IMPLANT
DECANTER SPIKE VIAL GLASS SM (MISCELLANEOUS) IMPLANT
DERMABOND ADVANCED (GAUZE/BANDAGES/DRESSINGS) ×2
DERMABOND ADVANCED .7 DNX12 (GAUZE/BANDAGES/DRESSINGS) ×1 IMPLANT
DRAPE C-ARM 42X72 X-RAY (DRAPES) IMPLANT
DRAPE C-ARMOR (DRAPES) ×3 IMPLANT
DRAPE LAPAROTOMY 100X72X124 (DRAPES) ×3 IMPLANT
DRAPE POUCH INSTRU U-SHP 10X18 (DRAPES) ×3 IMPLANT
DRAPE SURG 17X23 STRL (DRAPES) ×3 IMPLANT
DRILL PLIF MAS DISP 5.5MM (DRILL) ×3
DRSG OPSITE 4X5.5 SM (GAUZE/BANDAGES/DRESSINGS) ×3 IMPLANT
DRSG OPSITE POSTOP 4X8 (GAUZE/BANDAGES/DRESSINGS) ×3 IMPLANT
DURAPREP 26ML APPLICATOR (WOUND CARE) ×3 IMPLANT
ELECT REM PT RETURN 9FT ADLT (ELECTROSURGICAL) ×3
ELECTRODE REM PT RTRN 9FT ADLT (ELECTROSURGICAL) ×1 IMPLANT
EVACUATOR 1/8 PVC DRAIN (DRAIN) ×3 IMPLANT
GAUZE SPONGE 4X4 12PLY STRL (GAUZE/BANDAGES/DRESSINGS) IMPLANT
GAUZE SPONGE 4X4 16PLY XRAY LF (GAUZE/BANDAGES/DRESSINGS) IMPLANT
GLOVE BIO SURGEON STRL SZ 6.5 (GLOVE) ×4 IMPLANT
GLOVE BIO SURGEON STRL SZ8 (GLOVE) ×6 IMPLANT
GLOVE BIO SURGEONS STRL SZ 6.5 (GLOVE) ×2
GLOVE BIOGEL PI IND STRL 7.5 (GLOVE) ×2 IMPLANT
GLOVE BIOGEL PI IND STRL 8 (GLOVE) ×2 IMPLANT
GLOVE BIOGEL PI IND STRL 8.5 (GLOVE) ×2 IMPLANT
GLOVE BIOGEL PI INDICATOR 7.5 (GLOVE) ×4
GLOVE BIOGEL PI INDICATOR 8 (GLOVE) ×4
GLOVE BIOGEL PI INDICATOR 8.5 (GLOVE) ×4
GLOVE ECLIPSE 6.5 STRL STRAW (GLOVE) ×3 IMPLANT
GLOVE ECLIPSE 7.0 STRL STRAW (GLOVE) ×3 IMPLANT
GLOVE ECLIPSE 8.0 STRL XLNG CF (GLOVE) ×6 IMPLANT
GOWN STRL REUS W/ TWL LRG LVL3 (GOWN DISPOSABLE) ×1 IMPLANT
GOWN STRL REUS W/ TWL XL LVL3 (GOWN DISPOSABLE) ×3 IMPLANT
GOWN STRL REUS W/TWL 2XL LVL3 (GOWN DISPOSABLE) ×6 IMPLANT
GOWN STRL REUS W/TWL LRG LVL3 (GOWN DISPOSABLE) ×2
GOWN STRL REUS W/TWL XL LVL3 (GOWN DISPOSABLE) ×6
HEMOSTAT POWDER KIT SURGIFOAM (HEMOSTASIS) ×3 IMPLANT
KIT BASIN OR (CUSTOM PROCEDURE TRAY) ×3 IMPLANT
KIT POSITION SURG JACKSON T1 (MISCELLANEOUS) ×3 IMPLANT
KIT ROOM TURNOVER OR (KITS) ×3 IMPLANT
MILL MEDIUM DISP (BLADE) ×3 IMPLANT
MODULE NVM5 NEXT GEN EMG (NEEDLE) ×3 IMPLANT
NEEDLE HYPO 21X1.5 SAFETY (NEEDLE) ×3 IMPLANT
NEEDLE HYPO 25X1 1.5 SAFETY (NEEDLE) ×3 IMPLANT
NEEDLE SPNL 18GX3.5 QUINCKE PK (NEEDLE) IMPLANT
NS IRRIG 1000ML POUR BTL (IV SOLUTION) ×3 IMPLANT
PACK LAMINECTOMY NEURO (CUSTOM PROCEDURE TRAY) ×3 IMPLANT
PAD ARMBOARD 7.5X6 YLW CONV (MISCELLANEOUS) ×9 IMPLANT
PATTIES SURGICAL .5 X.5 (GAUZE/BANDAGES/DRESSINGS) IMPLANT
PATTIES SURGICAL .5 X1 (DISPOSABLE) IMPLANT
PATTIES SURGICAL 1X1 (DISPOSABLE) ×3 IMPLANT
ROD PREBENT PLIF 90MM (Rod) ×6 IMPLANT
SCREW LOCK (Screw) ×16 IMPLANT
SCREW LOCK FXNS SPNE MAS PL (Screw) ×8 IMPLANT
SCREW PLIF MAS 5.5X35 LUMBAR (Screw) ×3 IMPLANT
SCREW SHANK 5.5X40MM (Screw) ×12 IMPLANT
SCREW SHANK PLIF MAS 5.5X40 (Screw) ×3 IMPLANT
SCREW SHANKS 5.5X35 (Screw) ×6 IMPLANT
SCREW TULIP 5.5 (Screw) ×18 IMPLANT
SPONGE LAP 4X18 X RAY DECT (DISPOSABLE) ×6 IMPLANT
SPONGE SURGIFOAM ABS GEL 100 (HEMOSTASIS) ×3 IMPLANT
STAPLER SKIN PROX WIDE 3.9 (STAPLE) IMPLANT
STRIP CLOSURE SKIN 1/2X4 (GAUZE/BANDAGES/DRESSINGS) ×2 IMPLANT
SUT VIC AB 1 CT1 18XBRD ANBCTR (SUTURE) ×1 IMPLANT
SUT VIC AB 1 CT1 8-18 (SUTURE) ×2
SUT VIC AB 2-0 CT1 18 (SUTURE) ×3 IMPLANT
SUT VIC AB 3-0 SH 8-18 (SUTURE) ×3 IMPLANT
SYR 20CC LL (SYRINGE) ×3 IMPLANT
SYR 3ML LL SCALE MARK (SYRINGE) IMPLANT
SYR 5ML LL (SYRINGE) IMPLANT
TOWEL OR 17X24 6PK STRL BLUE (TOWEL DISPOSABLE) ×3 IMPLANT
TOWEL OR 17X26 10 PK STRL BLUE (TOWEL DISPOSABLE) ×3 IMPLANT
TRAP SPECIMEN MUCOUS 40CC (MISCELLANEOUS) ×3 IMPLANT
TRAY FOLEY SILVER 16FR TEMP (SET/KITS/TRAYS/PACK) ×3 IMPLANT
WATER STERILE IRR 1000ML POUR (IV SOLUTION) ×3 IMPLANT

## 2015-05-30 NOTE — Anesthesia Procedure Notes (Signed)
Procedure Name: Intubation Date/Time: 05/30/2015 12:26 PM Performed by: Jenne Campus Pre-anesthesia Checklist: Patient identified, Emergency Drugs available, Suction available, Patient being monitored and Timeout performed Patient Re-evaluated:Patient Re-evaluated prior to inductionOxygen Delivery Method: Circle system utilized Preoxygenation: Pre-oxygenation with 100% oxygen Intubation Type: IV induction Ventilation: Mask ventilation without difficulty Laryngoscope Size: Miller and 2 Grade View: Grade I Tube type: Oral Tube size: 7.5 mm Number of attempts: 1 Airway Equipment and Method: Stylet and LTA kit utilized Placement Confirmation: ETT inserted through vocal cords under direct vision,  positive ETCO2,  CO2 detector and breath sounds checked- equal and bilateral Secured at: 22 cm Tube secured with: Tape Dental Injury: Teeth and Oropharynx as per pre-operative assessment  Comments: Soft bite block utilized.

## 2015-05-30 NOTE — Op Note (Signed)
05/30/2015  5:32 PM  PATIENT:  Shaun Fleming  64 y.o. male  PRE-OPERATIVE DIAGNOSIS:  Lumbar scoliosis, Stenosis, spondylolisthesis, radiculopathy L 34, L 45, L 5 S 1 levels  POST-OPERATIVE DIAGNOSIS:  Lumbar scoliosis, Stenosis, spondylolisthesis, radiculopathy L 34, L 45, L 5 S 1 levels  PROCEDURE:  Procedure(s): LUMBAR THREE-FOUR, LUMBAR FOUR FIVE, LUMBAR FIVE SACRAL ONE MAXIMUM ACCESS (MAS) POSTERIOR LUMBAR INTERBODY FUSION (PLIF) with PEEK cages, autograft, pedicle screw fixation and posterolateral arthrodesis  (N/A)  Decompression greater than for standard PLIF procedure   SURGEON:  Surgeon(s) and Role:    * Maeola Harman, MD - Primary    * Lisbeth Renshaw, MD - Assisting  PHYSICIAN ASSISTANT:   ASSISTANTS: Poteat, RN   ANESTHESIA:   local and general  EBL:  Total I/O In: 4500 [I.V.:3700; Blood:550; IV Piggyback:250] Out: 1815 [Urine:565; Blood:1250]  BLOOD ADMINISTERED:450 CC CELLSAVER  DRAINS: (Medium) Hemovact drain(s) in the epidural space with  Suction Open   LOCAL MEDICATIONS USED:  MARCAINE    and LIDOCAINE   SPECIMEN:  No Specimen  DISPOSITION OF SPECIMEN:  N/A  COUNTS:  YES  TOURNIQUET:  * No tourniquets in log *  DICTATION: DICTATION: Patient is a 64 year old with mobile scoliosis, spondylolisthesis (anterolisthesis at L 34 and L 45, with scoliosis and foraminal stenosis at L 5 S 1level) , stenosis, disc herniation and severe back and bilateral lower extremity pain at L 34, L4/5 and L 5 S 1 levels of the lumbar spine. It was elected to take him to surgery for MASPLIF at L 34,  L 45 and L 5 S 1  levels with posterolateral arthrodesis.  Procedure:   Following uncomplicated induction of GETA, and placement of electrodes for neural monitoring, patient was turned into a prone position on the [place] tableand using AP  fluoroscopy the area of planned incision was marked, prepped with betadine scrub and Duraprep, then draped. Exposure was performed of  facet joint complex at L 34,  L 45 and L 5 S 1 levels and the MAS retractor was placed.5.5 x 35 mm cortical Nuvasive screws were placed at L 3 bilaterally according to standard landmarks using neural monitoring.  A total laminectomy of L 3,  L 4 and L 5 was then performed with disarticulation of facets.  Decompression was greater than for standard PLIF procedure and thorough decompression of the thecal sac, bilateral L 3, L 4, L 5, S1 nerve roots was performed along with foraminal and extraforaminal portions of these nerve roots.  There was a significant amount of ligamentous hypertrophy and this ligament was fused to the dura, requiring painstaking dissection under Loupe magnification.  This bone was saved for grafting, combined with allograft chips after being run through bone mill and was placed in bone packing device.  Thorough discectomy was performed bilaterally at L 5 S 1  and the endplates were prepared for grafting.  23 x 10 x 12 degree cages were placed in the interspace and positioning was confirmed with AP and lateral fluoroscopy.  15 cc of autograft was packed in the interspace medial to the second cage.   Thorough discectomy was performed bilaterally at L 45  and the endplates were prepared for grafting.  23 x 8 x 8 degree cages were placed in the interspace and positioning was confirmed with AP and lateral fluoroscopy.  8 cc of autograft was packed in the interspace medial to the second cage.   Thorough discectomy was performed bilaterally at L  34  and the endplates were prepared for grafting.  23 x 8 x 8 degree cages were placed in the interspace and positioning was confirmed with AP and lateral fluoroscopy.  8 cc of autograft was packed in the interspace medial to the second cage. Remaining screws were placed at L 4, L 5 and and S 1 and 90 mm rods were placed. And the screws were locked and torqued. Final Xrays showed well positioned implants and screw fixation. The posterolateral region was packed  with remaining 60 cc of autograft, 30 cc per side. The wounds were irrigated and then closed with 1, 2-0 and 3-0 Vicryl stitches. 20 cc long-acting Marcaine was injected into the musculature.  Sterile occlusive dressing was placed with Dermabond and an occlusive dressing. The patient was then extubated in the operating room and taken to recovery in stable and satisfactory condition having tolerated his operation well. Counts were correct at the end of the case.  There was good correction of sagittal balance and scoliotic curvature.  PLAN OF CARE: Admit to inpatient   PATIENT DISPOSITION:  PACU - hemodynamically stable.   Delay start of Pharmacological VTE agent (>24hrs) due to surgical blood loss or risk of bleeding: yes

## 2015-05-30 NOTE — Brief Op Note (Addendum)
05/30/2015  5:32 PM  PATIENT:  Shaun Fleming  64 y.o. male  PRE-OPERATIVE DIAGNOSIS:  Lumbar scoliosis, Stenosis, spondylolisthesis, radiculopathy L 34, L 45, L 5 S 1 levels  POST-OPERATIVE DIAGNOSIS:  Lumbar scoliosis, Stenosis, spondylolisthesis, radiculopathy L 34, L 45, L 5 S 1 levels  PROCEDURE:  Procedure(s): LUMBAR THREE-FOUR, LUMBAR FOUR FIVE, LUMBAR FIVE SACRAL ONE MAXIMUM ACCESS (MAS) POSTERIOR LUMBAR INTERBODY FUSION (PLIF) with PEEK cages, autograft, pedicle screw fixation and posterolateral arthrodesis  (N/A)  Decompression greater than for standard PLIF procedure   SURGEON:  Surgeon(s) and Role:    * Graham Hyun, MD - Primary    * Neelesh Nundkumar, MD - Assisting  PHYSICIAN ASSISTANT:   ASSISTANTS: Poteat, RN   ANESTHESIA:   local and general  EBL:  Total I/O In: 4500 [I.V.:3700; Blood:550; IV Piggyback:250] Out: 1815 [Urine:565; Blood:1250]  BLOOD ADMINISTERED:450 CC CELLSAVER  DRAINS: (Medium) Hemovact drain(s) in the epidural space with  Suction Open   LOCAL MEDICATIONS USED:  MARCAINE    and LIDOCAINE   SPECIMEN:  No Specimen  DISPOSITION OF SPECIMEN:  N/A  COUNTS:  YES  TOURNIQUET:  * No tourniquets in log *  DICTATION: DICTATION: Patient is a 64-year-old with mobile scoliosis, spondylolisthesis (anterolisthesis at L 34 and L 45, with scoliosis and foraminal stenosis at L 5 S 1level) , stenosis, disc herniation and severe back and bilateral lower extremity pain at L 34, L4/5 and L 5 S 1 levels of the lumbar spine. It was elected to take him to surgery for MASPLIF at L 34,  L 45 and L 5 S 1  levels with posterolateral arthrodesis.  Procedure:   Following uncomplicated induction of GETA, and placement of electrodes for neural monitoring, patient was turned into a prone position on the [place] tableand using AP  fluoroscopy the area of planned incision was marked, prepped with betadine scrub and Duraprep, then draped. Exposure was performed of  facet joint complex at L 34,  L 45 and L 5 S 1 levels and the MAS retractor was placed.5.5 x 35 mm cortical Nuvasive screws were placed at L 3 bilaterally according to standard landmarks using neural monitoring.  A total laminectomy of L 3,  L 4 and L 5 was then performed with disarticulation of facets.  Decompression was greater than for standard PLIF procedure and thorough decompression of the thecal sac, bilateral L 3, L 4, L 5, S1 nerve roots was performed along with foraminal and extraforaminal portions of these nerve roots.  There was a significant amount of ligamentous hypertrophy and this ligament was fused to the dura, requiring painstaking dissection under Loupe magnification.  This bone was saved for grafting, combined with allograft chips after being run through bone mill and was placed in bone packing device.  Thorough discectomy was performed bilaterally at L 5 S 1  and the endplates were prepared for grafting.  23 x 10 x 12 degree cages were placed in the interspace and positioning was confirmed with AP and lateral fluoroscopy.  15 cc of autograft was packed in the interspace medial to the second cage.   Thorough discectomy was performed bilaterally at L 45  and the endplates were prepared for grafting.  23 x 8 x 8 degree cages were placed in the interspace and positioning was confirmed with AP and lateral fluoroscopy.  8 cc of autograft was packed in the interspace medial to the second cage.   Thorough discectomy was performed bilaterally at L  34    and the endplates were prepared for grafting.  23 x 8 x 8 degree cages were placed in the interspace and positioning was confirmed with AP and lateral fluoroscopy.  8 cc of autograft was packed in the interspace medial to the second cage. Remaining screws were placed at L 4, L 5 and and S 1 and 90 mm rods were placed. And the screws were locked and torqued. Final Xrays showed well positioned implants and screw fixation. The posterolateral region was packed  with remaining 60 cc of autograft, 30 cc per side. The wounds were irrigated and then closed with 1, 2-0 and 3-0 Vicryl stitches. 20 cc long-acting Marcaine was injected into the musculature.  Sterile occlusive dressing was placed with Dermabond and an occlusive dressing. The patient was then extubated in the operating room and taken to recovery in stable and satisfactory condition having tolerated his operation well. Counts were correct at the end of the case.  There was good correction of sagittal balance and scoliotic curvature.  PLAN OF CARE: Admit to inpatient   PATIENT DISPOSITION:  PACU - hemodynamically stable.   Delay start of Pharmacological VTE agent (>24hrs) due to surgical blood loss or risk of bleeding: yes  

## 2015-05-30 NOTE — Interval H&P Note (Signed)
History and Physical Interval Note:  05/30/2015 8:35 AM  Shaun GanjaNasser Fleming  has presented today for surgery, with the diagnosis of Stenosis  The various methods of treatment have been discussed with the patient and family. After consideration of risks, benefits and other options for treatment, the patient has consented to  Procedure(s): L3-L4 - L4-L5 - L5-S1 MAXIMUM ACCESS (MAS) POSTERIOR LUMBAR INTERBODY FUSION (PLIF)   (N/A) as a surgical intervention .  The patient's history has been reviewed, patient examined, no change in status, stable for surgery.  I have reviewed the patient's chart and labs.  Questions were answered to the patient's satisfaction.     Darlina Mccaughey D

## 2015-05-30 NOTE — Anesthesia Postprocedure Evaluation (Signed)
Anesthesia Post Note  Patient: Shaun Fleming  Procedure(s) Performed: Procedure(s) (LRB): LUMBAR THREE-FOUR, LUMBAR FOUR-FIVE, LUMBAR FIVE-SACRAL ONE MAXIMUM ACCESS (MAS) POSTERIOR LUMBAR INTERBODY FUSION (PLIF)   (N/A)  Patient location during evaluation: PACU Anesthesia Type: General Level of consciousness: awake Pain management: pain level controlled Vital Signs Assessment: post-procedure vital signs reviewed and stable Respiratory status: spontaneous breathing Cardiovascular status: blood pressure returned to baseline Anesthetic complications: no    Last Vitals:  Filed Vitals:   05/30/15 1810 05/30/15 1815  BP:  135/77  Pulse: 76 64  Temp:    Resp: 12 12    Last Pain:  Filed Vitals:   05/30/15 1818  PainSc: Asleep    LLE Motor Response: Purposeful movement, Responds to commands LLE Sensation: No numbness RLE Motor Response: Purposeful movement, Responds to commands RLE Sensation: No numbness      EDWARDS,Trig Mcbryar

## 2015-05-30 NOTE — Transfer of Care (Signed)
Immediate Anesthesia Transfer of Care Note  Patient: Karma Ganjaasser Mcmillon  Procedure(s) Performed: Procedure(s): LUMBAR THREE-FOUR, LUMBAR FOUR-FIVE, LUMBAR FIVE-SACRAL ONE MAXIMUM ACCESS (MAS) POSTERIOR LUMBAR INTERBODY FUSION (PLIF)   (N/A)  Patient Location: PACU  Anesthesia Type:General  Level of Consciousness: awake and alert   Airway & Oxygen Therapy: Patient Spontanous Breathing and Patient connected to nasal cannula oxygen  Post-op Assessment: Report given to RN, Post -op Vital signs reviewed and stable and Patient moving all extremities X 4  Post vital signs: Reviewed and stable  Last Vitals:  Filed Vitals:   05/30/15 1008 05/30/15 1740  BP: 133/88 135/79  Pulse: 74 83  Temp: 36.4 C 36.5 C  Resp: 18 10    Complications: No apparent anesthesia complications

## 2015-05-30 NOTE — Anesthesia Preprocedure Evaluation (Addendum)
Anesthesia Evaluation  Patient identified by MRN, date of birth, ID band Patient awake    Reviewed: Allergy & Precautions, NPO status , Patient's Chart, lab work & pertinent test results  Airway Mallampati: I  TM Distance: >3 FB Neck ROM: Full    Dental   Pulmonary former smoker,    Pulmonary exam normal       Cardiovascular Normal cardiovascular exam    Neuro/Psych    GI/Hepatic   Endo/Other    Renal/GU      Musculoskeletal   Abdominal   Peds  Hematology   Anesthesia Other Findings   Reproductive/Obstetrics                             Anesthesia Physical Anesthesia Plan  ASA: II  Anesthesia Plan: General   Post-op Pain Management:    Induction: Intravenous  Airway Management Planned: Oral ETT  Additional Equipment:   Intra-op Plan:   Post-operative Plan: Extubation in OR  Informed Consent: I have reviewed the patients History and Physical, chart, labs and discussed the procedure including the risks, benefits and alternatives for the proposed anesthesia with the patient or authorized representative who has indicated his/her understanding and acceptance.     Plan Discussed with: CRNA and Surgeon  Anesthesia Plan Comments:         Anesthesia Quick Evaluation  

## 2015-05-30 NOTE — Interval H&P Note (Signed)
History and Physical Interval Note:  05/30/2015 9:48 AM  Shaun Fleming  has presented today for surgery, with the diagnosis of Stenosis  The various methods of treatment have been discussed with the patient and family. After consideration of risks, benefits and other options for treatment, the patient has consented to  Procedure(s): L3-L4 - L4-L5 - L5-S1 MAXIMUM ACCESS (MAS) POSTERIOR LUMBAR INTERBODY FUSION (PLIF)   (N/A) as a surgical intervention .  The patient's history has been reviewed, patient examined, no change in status, stable for surgery.  I have reviewed the patient's chart and labs.  Questions were answered to the patient's satisfaction.     Anika Shore D

## 2015-05-30 NOTE — Progress Notes (Signed)
Awake, alert, somnolent, but arousable.  MAEW.  Doing well.

## 2015-05-31 MED ORDER — PANTOPRAZOLE SODIUM 40 MG PO TBEC
40.0000 mg | DELAYED_RELEASE_TABLET | Freq: Every day | ORAL | Status: DC
  Administered 2015-05-31 – 2015-06-01 (×2): 40 mg via ORAL
  Filled 2015-05-31: qty 1

## 2015-05-31 MED FILL — Sodium Chloride IV Soln 0.9%: INTRAVENOUS | Qty: 3000 | Status: AC

## 2015-05-31 MED FILL — Heparin Sodium (Porcine) Inj 1000 Unit/ML: INTRAMUSCULAR | Qty: 30 | Status: AC

## 2015-05-31 NOTE — Evaluation (Signed)
Occupational Therapy Evaluation Patient Details Name: Shaun Fleming MRN: [id number] DOB: [birth year] Today's Date: 05/31/2015    History of Present Illness 64 yo male admitted s/p L3-4 L4-5L 5-s1 PMH: arthritis   Clinical Impression   Patient evaluated by Occupational Therapy with no further acute OT needs identified. All education has been completed and the patient has no further questions. See below for any follow-up Occupational Therapy or equipment needs. OT to sign off. Thank you for referral.   All education is complete and patient indicates understanding in regards to back precautions. Pt will have wife and son (A) upon d/c.   MD please address return to work with patient.     Follow Up Recommendations  No OT follow up    Equipment Recommendations  None recommended by OT    Recommendations for Other Services       Precautions / Restrictions Precautions Precautions: Back Precaution Comments: back handout provided and reviewed in detail Required Braces or Orthoses: Spinal Brace Spinal Brace: Lumbar corset;Applied in sitting position      Mobility Bed Mobility Overal bed mobility: Needs Assistance Bed Mobility: Supine to Sit     Supine to sit: Supervision     General bed mobility comments: no rails hob flat  Transfers Overall transfer level: Needs assistance   Transfers: Sit to/from Stand Sit to Stand: Supervision              Balance Overall balance assessment:  (guarded for first attempt at OOB)                                          ADL Overall ADL's : Needs assistance/impaired Eating/Feeding: Independent   Grooming: Wash/dry hands;Oral care;Wash/dry face;Supervision/safety;Standing Grooming Details (indicate cue type and reason): excellent recall of precautions Upper Body Bathing: Supervision/ safety;Sitting   Lower Body Bathing: Supervison/ safety;Sit to/from stand Lower Body Bathing Details (indicate cue type  and reason): able to cross bil Le                     Functional mobility during ADLs: Supervision/safety General ADL Comments: Pt educated on back handout and able to verbalize back precautions. 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 pt advised not to sit longer than 2 hours at a time. pt advised to set alarm at night for medication  (pain management)      Vision     Perception     Praxis      Pertinent Vitals/Pain Pain Assessment: 0-10 Pain Score: 8  Pain Location: surgical site Pain Descriptors / Indicators: Operative site guarding Pain Intervention(s): Monitored during session;Repositioned;Patient requesting pain meds-RN notified     Hand Dominance Right   Extremity/Trunk Assessment Upper Extremity Assessment Upper Extremity Assessment: LUE deficits/detail LUE Deficits / Details: reports numbness in L hand. New numbness   Lower Extremity Assessment Lower Extremity Assessment: Defer to PT evaluation   Cervical / Trunk Assessment Cervical / Trunk Assessment: Other exceptions Cervical / Trunk Exceptions: s/p surg   Communication Communication Communication: No difficulties   Cognition Arousal/Alertness: Awake/alert Behavior During Therapy: WFL for tasks assessed/performed Overall Cognitive Status: Within Functional Limits for tasks assessed                     General Comments       Exercises  Shoulder Instructions      Home Living Family/patient expects to be discharged to:: Private residence Living Arrangements: Spouse/significant other;Children;Other (Comment) ([place] from [place] to ([place] Available Help at Discharge: Family;Available 24 hours/day Type of Home: House (5000sq ft home with basement) Home Access: Level entry Entrance Stairs-Number of Steps: 0   Home Layout: Multi-level;Bed/bath upstairs Alternate Level Stairs-Number of Steps: has a  chair lift to second floor/ stairs to basement to workout room   Bathroom Shower/Tub: Walk-in shower   Bathroom Toilet: Standard     Home Equipment: None   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      Prior Functioning/Environment Level of Independence: Independent             OT Diagnosis: Generalized weakness;Acute pain   OT Problem List:     OT Treatment/Interventions:      OT Goals(Current goals can be found in the care plan section) Acute Rehab OT Goals Patient Stated Goal: to return to work Potential to Achieve Goals: Good  OT Frequency:     Barriers to D/C:            Co-evaluation              End of Session Equipment Utilized During Treatment: Back brace Nurse Communication: Mobility status;Precautions  Activity Tolerance: Patient tolerated treatment well Patient left: in chair;with call bell/phone within reach   Time: 0826-0842 OT Time Calculation (min): 16 min Charges:  OT General Charges $OT Visit: 1 Procedure OT Evaluation $Initial OT Evaluation Tier I: 1 Procedure G-Codes:    Boone MasterJones, Parmvir Boomer B 05/31/2015, 8:54 AM  Mateo FlowJones, Brynn   OTR/L Pager: [phone]: [phone] Office: [phone] .

## 2015-05-31 NOTE — Progress Notes (Signed)
Utilization review completed.  

## 2015-05-31 NOTE — Evaluation (Signed)
Physical Therapy Evaluation and Discharge Patient Details Name: Shaun Fleming MRN: [id number] DOB: April 02, 1951 Today's Date: 05/31/2015   History of Present Illness  64 yo male admitted s/p L3-4 L4-5L 5-s1 PMH: arthritis  Clinical Impression  Patient evaluated by Physical Therapy with no further acute PT needs identified. All education has been completed and the patient has no further questions. At the time of PT eval pt was able to demonstrate transfers and ambulation with modified independence and good maintenance of back precautions. See below for any follow-up Physical Therapy or equipment needs. PT is signing off. Thank you for this referral.     Follow Up Recommendations Outpatient PT (When appropriate per post-op protocol)    Equipment Recommendations  None recommended by PT    Recommendations for Other Services       Precautions / Restrictions Precautions Precautions: Back Precaution Booklet Issued: Yes (comment) Precaution Comments: Reviewed handout again and pt was able to recall 3/3 back precautions by end of session.  Required Braces or Orthoses: Spinal Brace Spinal Brace: Lumbar corset;Applied in sitting position Restrictions Weight Bearing Restrictions: No      Mobility  Bed Mobility Overal bed mobility: Needs Assistance Bed Mobility: Supine to Sit     Supine to sit: Supervision     General bed mobility comments: Pt was received sitting in the chair.   Transfers Overall transfer level: Needs assistance Equipment used: None Transfers: Sit to/from Stand Sit to Stand: Modified independent (Device/Increase time)         General transfer comment: No assist required. No unsteadiness noted.   Ambulation/Gait Ambulation/Gait assistance: Modified independent (Device/Increase time) Ambulation Distance (Feet): 600 Feet Assistive device: None Gait Pattern/deviations: WFL(Within Functional Limits) Gait velocity: Decreased Gait velocity interpretation:  Below normal speed for age/gender General Gait Details: Pt with slightly slowed gait speed but was otherwise ambulating well.   Stairs Stairs: Yes Stairs assistance: Modified independent (Device/Increase time) Stair Management: One rail Right;Alternating pattern;Step to pattern;Forwards Number of Stairs: 10 General stair comments: Good negotiation of stairs. No unsteadiness noted.   Wheelchair Mobility    Modified Rankin (Stroke Patients Only)       Balance Overall balance assessment: No apparent balance deficits (not formally assessed)                                           Pertinent Vitals/Pain Pain Assessment: 0-10 Pain Score: 7  Pain Location: Incision Pain Descriptors / Indicators: Operative site guarding;Discomfort;Grimacing Pain Intervention(s): Limited activity within patient's tolerance;Monitored during session;Repositioned    Home Living Family/patient expects to be discharged to:: Private residence Living Arrangements: Spouse/significant other;Children;Other (Comment) (son visiting from [place] to (A)) Available Help at Discharge: Family;Available 24 hours/day Type of Home: House (5000sq ft home with basement) Home Access: Level entry   Entrance Stairs-Number of Steps: 0 Home Layout: Multi-level;Bed/bath upstairs Home Equipment: Shower seat 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 New numbness   Lower Extremity Assessment: Overall WFL for tasks assessed      Cervical / Trunk Assessment: Normal  Communication   Communication: No difficulties  Cognition Arousal/Alertness: Awake/alert Behavior During Therapy: WFL for tasks assessed/performed Overall  Cognitive Status: Within Functional Limits for tasks assessed                      General Comments      Exercises        Assessment/Plan    PT Assessment Patent does not need any further PT services  PT Diagnosis Difficulty walking;Acute pain   PT Problem List    PT Treatment Interventions     PT Goals (Current goals can be found in the Care Plan section) Acute Rehab PT Goals Patient Stated Goal: to return to work PT Goal Formulation: All assessment and education complete, DC therapy    Frequency     Barriers to discharge        Co-evaluation               End of Session Equipment Utilized During Treatment: Back brace Activity Tolerance: Patient tolerated treatment well Patient left: in chair;with call bell/phone within reach Nurse Communication: Mobility status         Time: 0910-0926 PT Time Calculation (min) (ACUTE ONLY): 16 min   Charges:   PT Evaluation $Initial PT Evaluation Tier I: 1 Procedure     PT G CodesConni Slipper:        Zyere Jiminez 05/31/2015, 9:45 AM   Conni SlipperLaura Clifford Coudriet, PT, DPT Acute Rehabilitation Services Pager: [phone]

## 2015-05-31 NOTE — Progress Notes (Signed)
Subjective: Patient reports "I feel good...just sore from the surgery, but nothing like before. No leg pain. No numbness."  Objective: Vital signs in last 24 hours: Temp:  [97.3 F (36.3 C)-98.5 F (36.9 C)] 98.5 F (36.9 C) (11/30 0734) Pulse Rate:  [55-92] 92 (11/30 0734) Resp:  [5-26] 18 (11/30 0734) BP: (95-153)/(53-88) 95/53 mmHg (11/30 0734) SpO2:  [96 %-100 %] 99 % (11/30 0734)  Intake/Output from previous day: 11/29 0701 - 11/30 0700 In: 4500 [I.V.:3700; Blood:550; IV Piggyback:250] Out: 3995 [Urine:1940; Drains:805; Blood:1250] Intake/Output this shift:    Alert, conversant. MAEW. Good strength BLE. Ambulated this AM. Reports only incisional soreness - denies buttock/leg pain & numbness. Incision without erythema, swelling, drainage beneath honeycomb and dermabond. Hemovac patent.  Lab Results: No results for input(s): WBC, HGB, HCT, PLT in the last 72 hours. BMET No results for input(s): NA, K, CL, CO2, GLUCOSE, BUN, CREATININE, CALCIUM in the last 72 hours.  Studies/Results: Dg Lumbar Spine 2-3 Views  05/30/2015  CLINICAL DATA:  L3-4 MAS PLIF EXAM: DG C-ARM 61-120 MIN; LUMBAR SPINE - 2-3 VIEW COMPARISON:  [id number] FINDINGS: Fluoroscopy for L3-4, L4-5, L5-S1 PLIF. Intervertebral grafts appear normally positioned. No indication of extra osseous screw placement. No visualized intraoperative fracture. IMPRESSION: Fluoroscopy for L3-4 to L5-S1 PLIF. Electronically Signed   By: Marnee SpringJonathon  Watts M.D.   On: 05/30/2015 17:49   Dg C-arm 1-60 Min  05/30/2015  CLINICAL DATA:  L3-4 MAS PLIF EXAM: DG C-ARM 61-120 MIN; LUMBAR SPINE - 2-3 VIEW COMPARISON:  [id number] FINDINGS: Fluoroscopy for L3-4, L4-5, L5-S1 PLIF. Intervertebral grafts appear normally positioned. No indication of extra osseous screw placement. No visualized intraoperative fracture. IMPRESSION: Fluoroscopy for L3-4 to L5-S1 PLIF. Electronically Signed   By: Marnee SpringJonathon  Watts M.D.   On: 05/30/2015 17:49     Assessment/Plan: Improved    LOS: 1 day  Continue to mobilize in LSO. Continue Hemovac today.    Georgiann Cockeroteat, Celinda Dethlefs 05/31/2015, 8:10 AM

## 2015-06-01 ENCOUNTER — Encounter (HOSPITAL_COMMUNITY): Payer: Self-pay | Admitting: Neurosurgery

## 2015-06-01 NOTE — Discharge Summary (Signed)
Physician Discharge Summary  Patient ID: Shaun Ganjaasser Head MRN: [id number] DOB/AGE: Nov 20, 1950 64 y.o.  Admit date: 05/30/2015 Discharge date: 06/01/2015  Admission Diagnoses: Lumbar scoliosis, Stenosis, spondylolisthesis, radiculopathy L 34, L 45, L 5 S 1 levels   Discharge Diagnoses: Lumbar scoliosis, Stenosis, spondylolisthesis, radiculopathy L 34, L 45, L 5 S 1 levels s/p LUMBAR THREE-FOUR, LUMBAR FOUR FIVE, LUMBAR FIVE SACRAL ONE MAXIMUM ACCESS (MAS) POSTERIOR LUMBAR INTERBODY FUSION (PLIF) with PEEK cages, autograft, pedicle screw fixation and posterolateral arthrodesis  (N/A)  Active Problems:   Lumbar spine scoliosis   Discharged Condition: good  Hospital Course: Shaun Fleming was admitted for surgery with dx scoliosis, spondylolisthesis, and radiculopathy. Following uncomplicated decompression and fusion, he recovered well in Neuro PACU and transferred to [facility] for nursing care and therapies. He has progressed nicely.   Consults: None  Significant Diagnostic Studies: radiology: X-Ray: intra-operative  Treatments: surgery: LUMBAR THREE-FOUR, LUMBAR FOUR FIVE, LUMBAR FIVE SACRAL ONE MAXIMUM ACCESS (MAS) POSTERIOR LUMBAR INTERBODY FUSION (PLIF) with PEEK cages, autograft, pedicle screw fixation and posterolateral arthrodesis  (N/A)   Discharge Exam: Blood pressure 118/63, pulse 89, temperature 98.6 F (37 C), temperature source Oral, resp. rate 20, SpO2 97 %. Alert, conversant. Ambulated this am. Reports no pain at present, no numbness since surgery. Good strength BLE. Incision without erythema, swelling, or drainage beneath honeycomb & Dermabond.     Disposition: 01-Home or Self Care  Rx's to chart for Percocet 5/325 1-2 po q4hrs prn pain #60, Robaxin 500mg  1 po q8hrs prn spasm #60. Pt verbalizes understanding of d/c instructions and agrees to call office to schedule 3-4 week f/u.       Medication List    ASK your doctor about these medications        aspirin EC 81 MG  tablet  Take 81 mg by mouth every evening.     atorvastatin 20 MG tablet  Commonly known as:  LIPITOR  Take 10 mg by mouth at bedtime.     atorvastatin 20 MG tablet  Commonly known as:  LIPITOR  Take 1 tablet (20 mg total) by mouth daily at 6 PM. NEED OV.     cephALEXin 500 MG capsule  Commonly known as:  KEFLEX  Take 1 capsule (500 mg total) by mouth 4 (four) times daily.     multivitamin with minerals Tabs tablet  Take 1 tablet by mouth daily.         Signed: Georgiann Cockeroteat, Myson Levi 06/01/2015, 7:56 AM

## 2015-06-01 NOTE — Progress Notes (Signed)
Patient alert and oriented, mae's well, voiding adequate amount of urine, swallowing without difficulty, no c/o pain. Patient discharged home with family. Script and discharged instructions given to patient. Patient and family stated understanding of d/c instructions given and has an appointment with MD. 

## 2015-06-01 NOTE — Progress Notes (Signed)
Subjective: Patient reports "I feel good"  Objective: Vital signs in last 24 hours: Temp:  [98.3 F (36.8 C)-98.8 F (37.1 C)] 98.6 F (37 C) (12/01 0750) Pulse Rate:  [75-94] 89 (12/01 0750) Resp:  [16-20] 20 (12/01 0750) BP: (96-122)/(50-63) 118/63 mmHg (12/01 0750) SpO2:  [95 %-100 %] 97 % (12/01 0750)  Intake/Output from previous day: 11/30 0701 - 12/01 0700 In: 360 [P.O.:360] Out: 1075 [Urine:700; Drains:375] Intake/Output this shift:    Alert, conversant. Ambulated this am. Reports no pain at present, no numbness since surgery. Good strength BLE. Incision without erythema, swelling, or drainage beneath honeycomb & Dermabond. Hemovac ~2300ml output. VSS.   Lab Results: No results for input(s): WBC, HGB, HCT, PLT in the last 72 hours. BMET No results for input(s): NA, K, CL, CO2, GLUCOSE, BUN, CREATININE, CALCIUM in the last 72 hours.  Studies/Results: Dg Lumbar Spine 2-3 Views  05/30/2015  CLINICAL DATA:  L3-4 MAS PLIF EXAM: DG C-ARM 61-120 MIN; LUMBAR SPINE - 2-3 VIEW COMPARISON:  [id number] FINDINGS: Fluoroscopy for L3-4, L4-5, L5-S1 PLIF. Intervertebral grafts appear normally positioned. No indication of extra osseous screw placement. No visualized intraoperative fracture. IMPRESSION: Fluoroscopy for L3-4 to L5-S1 PLIF. Electronically Signed   By: Marnee SpringJonathon  Watts M.D.   On: 05/30/2015 17:49   Dg C-arm 1-60 Min  05/30/2015  CLINICAL DATA:  L3-4 MAS PLIF EXAM: DG C-ARM 61-120 MIN; LUMBAR SPINE - 2-3 VIEW COMPARISON:  [id number] FINDINGS: Fluoroscopy for L3-4, L4-5, L5-S1 PLIF. Intervertebral grafts appear normally positioned. No indication of extra osseous screw placement. No visualized intraoperative fracture. IMPRESSION: Fluoroscopy for L3-4 to L5-S1 PLIF. Electronically Signed   By: Marnee SpringJonathon  Watts M.D.   On: 05/30/2015 17:49    Assessment/Plan: Improved    LOS: 2 days  Per DrStern, d/c Hemovac, d/c to home. Rx's to chart for Percocet 5/325 1-2 po q4hrs prn pain #60,  Robaxin 500mg  1 po q8hrs prn spasm #60. Pt verbalizes understanding of d/c instructions and agrees to call office to schedule 3-4 week f/u.    Georgiann Cockeroteat, Joua Bake 06/01/2015, 7:52 AM

## 2015-06-03 LAB — POCT I-STAT 4, (NA,K, GLUC, HGB,HCT)
Glucose, Bld: 122 mg/dL — ABNORMAL HIGH (ref 65–99)
HCT: 30 % — ABNORMAL LOW (ref 39.0–52.0)
Hemoglobin: 10.2 g/dL — ABNORMAL LOW (ref 13.0–17.0)
Potassium: 4.3 mmol/L (ref 3.5–5.1)
SODIUM: 137 mmol/L (ref 135–145)

## 2015-06-27 ENCOUNTER — Telehealth: Payer: Self-pay | Admitting: Internal Medicine

## 2015-07-10 ENCOUNTER — Telehealth: Payer: Self-pay | Admitting: Internal Medicine

## 2015-07-11 NOTE — Telephone Encounter (Signed)
Close encounter 

## 2015-07-12 ENCOUNTER — Ambulatory Visit: Payer: Medicare Other | Admitting: Internal Medicine

## 2015-11-15 ENCOUNTER — Ambulatory Visit: Payer: Medicare Other | Admitting: Neurology

## 2015-11-29 ENCOUNTER — Encounter: Payer: Self-pay | Admitting: Interventional Cardiology

## 2015-12-03 DIAGNOSIS — Z8249 Family history of ischemic heart disease and other diseases of the circulatory system: Secondary | ICD-10-CM | POA: Insufficient documentation

## 2015-12-03 DIAGNOSIS — R079 Chest pain, unspecified: Secondary | ICD-10-CM | POA: Insufficient documentation

## 2015-12-04 ENCOUNTER — Encounter: Payer: Self-pay | Admitting: Interventional Cardiology

## 2015-12-04 ENCOUNTER — Ambulatory Visit (INDEPENDENT_AMBULATORY_CARE_PROVIDER_SITE_OTHER): Payer: 59 | Admitting: Interventional Cardiology

## 2015-12-04 ENCOUNTER — Telehealth (HOSPITAL_COMMUNITY): Payer: Self-pay | Admitting: *Deleted

## 2015-12-04 VITALS — BP 100/62 | HR 60 | Ht 70.0 in | Wt 193.8 lb

## 2015-12-04 DIAGNOSIS — Z8249 Family history of ischemic heart disease and other diseases of the circulatory system: Secondary | ICD-10-CM | POA: Diagnosis not present

## 2015-12-04 DIAGNOSIS — E785 Hyperlipidemia, unspecified: Secondary | ICD-10-CM | POA: Diagnosis not present

## 2015-12-04 DIAGNOSIS — R079 Chest pain, unspecified: Secondary | ICD-10-CM

## 2015-12-04 NOTE — Telephone Encounter (Signed)
Patient given detailed instructions per Myocardial Perfusion Study Information Sheet for the test on 12/05/15 at 0730. Patient notified to arrive 15 minutes early and that it is imperative to arrive on time for appointment to keep from having the test rescheduled.  If you need to cancel or reschedule your appointment, please call the office within 24 hours of your appointment. Failure to do so may result in a cancellation of your appointment, and a $50 no show fee. Patient verbalized understanding.Shaun Fleming, Shaun Fleming

## 2015-12-04 NOTE — Progress Notes (Signed)
Cardiology Office Note    Date:  12/04/2015   ID:  Shaun Fleming, DOB [birth year], MRN [id number]  PCP:  Emeterio Reeve, MD  Cardiologist: Lesleigh Noe, MD   Chief Complaint  Patient presents with  . Chest Pain    History of Present Illness:  Shaun Fleming is a 65 y.o. male for evaluation of chest pain.  History of relatively recent recurring episodes of localized left chest discomfort. Onset is random. Not associated with dyspnea or other complaints. Not precipitated by physical activity. No relieving components have been identified. No associated dyspnea, palpitation, or other complaints.    Past Medical History  Diagnosis Date  . Hyperlipidemia   . Arthritis     Past Surgical History  Procedure Laterality Date  . Knee surgery Left     ACL tear with left knee  . Hemorroidectomy  1980  . Maximum access (mas)posterior lumbar interbody fusion (plif) 3 level N/A 05/30/2015    Procedure: LUMBAR THREE-FOUR, LUMBAR FOUR-FIVE, LUMBAR FIVE-SACRAL ONE MAXIMUM ACCESS (MAS) POSTERIOR LUMBAR INTERBODY FUSION (PLIF)  ;  Surgeon: Maeola Harman, MD;  Location: [facility];  Service: Neurosurgery;  Laterality: N/A;    Current Medications: Outpatient Prescriptions Prior to Visit  Medication Sig Dispense Refill  . aspirin EC 81 MG tablet Take 81 mg by mouth every evening.     Marland Kitchen atorvastatin (LIPITOR) 20 MG tablet Take 1 tablet (20 mg total) by mouth daily at 6 PM. NEED OV. 60 tablet 0  . Multiple Vitamin (MULTIVITAMIN WITH MINERALS) TABS tablet Take 1 tablet by mouth daily.    Marland Kitchen atorvastatin (LIPITOR) 20 MG tablet Take 10 mg by mouth at bedtime.    . cephALEXin (KEFLEX) 500 MG capsule Take 1 capsule (500 mg total) by mouth 4 (four) times daily. (Patient not taking: Reported on 05/23/2015) 40 capsule 0   No facility-administered medications prior to visit.     Allergies:   Review of patient's allergies indicates no known allergies.   Social History   Social History  .  Marital Status: Married    Spouse Name: N/A  . Number of Children: N/A  . Years of Education: N/A   Social History Main Topics  . Smoking status: Former Smoker -- 0.25 packs/day for 20 years    Types: Cigarettes    Quit date: 08/31/2012  . Smokeless tobacco: Never Used     Comment: Only smoked 1- 2 cigs a day  . Alcohol Use: 6.6 oz/week    8 Standard drinks or equivalent, 3 Glasses of wine per week  . Drug Use: No  . Sexual Activity: Not Asked   Other Topics Concern  . None   Social History Narrative     Family History:  The patient's Father had myocardial infarction in his 65s. family history includes Heart attack in his father.   ROS:   Please see the history of present illness.Stress related to his job He has low back discomfort with some dysesthesias in the left arm related to lumbar disc disease. Relatively recent surgery.   All other systems reviewed and are negative.   PHYSICAL EXAM:   VS:  BP 100/62 mmHg  Pulse 60  Ht  (1.778 m)  Wt 193 lb 12.8 oz (87.907 kg)  BMI 27.81 kg/m2   GEN: Well nourished, well developed, in no acute distress HEENT: normal Neck: no JVD, carotid bruits, or masses Cardiac: RRR; no murmurs, rubs, or gallops,no edema  Respiratory:  clear to auscultation bilaterally,  normal work of breathing GI: soft, nontender, nondistended, + BS MS: no deformity or atrophy Skin: warm and dry, no rash Neuro:  Alert and Oriented x 3, Strength and sensation are intact Psych: euthymic mood, full affect  Wt Readings from Last 3 Encounters:  12/04/15 193 lb 12.8 oz (87.907 kg)  05/23/15 180 lb (81.647 kg)  03/26/15 182 lb (82.555 kg)      Studies/Labs Reviewed:   EKG:  EKG  Is normal. There is normal sinus rhythm and no abnormality noted.  Recent Labs: 03/26/2015: ALT 20 05/23/2015: BUN 12; Creatinine, Ser 1.20; Platelets 192 05/30/2015: Hemoglobin 10.2*; Potassium 4.3; Sodium 137   Lipid Panel    Component Value Date/Time   CHOL 170  03/03/2014 0803   TRIG 83 03/03/2014 0803   HDL 56 03/03/2014 0803   LDLCALC 97 03/03/2014 0803    Additional studies/ records that were reviewed today include: Awaiting laboratory data from Dr. Jerrilyn CairoGates/Walters. The patient remembers his total cholesterol to be 155.  Prior negative exercise treadmill test in 1995.   ASSESSMENT:    1. Chest pain, unspecified chest pain type   2. Family history of heart disease   3. Dyslipidemia      PLAN:  In order of problems listed above: 1. Stress Myoview to rule out myocardial ischemia 2.  No workup needed  3. Obtain recent laboratory data from primary care.    Medication Adjustments/Labs and Tests Ordered: Current medicines are reviewed at length with the patient today.  Concerns regarding medicines are outlined above.  Medication changes, Labs and Tests ordered today are listed in the Patient Instructions below. There are no Patient Instructions on file for this visit.   Signed, Lesleigh NoeHenry W Itamar Mcgowan III, MD  12/04/2015 10:46 AM    [facility] [address], [place], [place]  [address] Phone: [phone]; Fax: [phone]) [phone]

## 2015-12-04 NOTE — Patient Instructions (Signed)
Medication Instructions:  Your physician recommends that you continue on your current medications as directed. Please refer to the Current Medication list given to you today.   Labwork: We will request copies of your recent labs from your primary care physician  Testing/Procedures: Your physician has requested that you have en exercise stress myoview. For further information please visit [email or web address]. Please follow instruction sheet, as given.   Follow-Up: Your physician recommends that you schedule a follow-up appointment pending test results   Any Other Special Instructions Will Be Listed Below (If Applicable).     If you need a refill on your cardiac medications before your next appointment, please call your pharmacy.

## 2015-12-05 ENCOUNTER — Encounter (HOSPITAL_COMMUNITY): Payer: 59

## 2015-12-06 ENCOUNTER — Telehealth: Payer: Self-pay

## 2015-12-06 NOTE — Telephone Encounter (Signed)
Called [facility] [address]Wolter's office to rqst pt most recent lab results be faxed to our office attn:Dr.Smith

## 2015-12-14 ENCOUNTER — Other Ambulatory Visit: Payer: Self-pay

## 2015-12-14 DIAGNOSIS — R0789 Other chest pain: Secondary | ICD-10-CM

## 2015-12-21 ENCOUNTER — Encounter (INDEPENDENT_AMBULATORY_CARE_PROVIDER_SITE_OTHER): Payer: 59

## 2015-12-21 DIAGNOSIS — R0789 Other chest pain: Secondary | ICD-10-CM

## 2015-12-21 LAB — EXERCISE TOLERANCE TEST
CHL CUP MPHR: 154 {beats}/min
CHL CUP RESTING HR STRESS: 63 {beats}/min
CSEPED: 9 min
CSEPPHR: 151 {beats}/min
Estimated workload: 10.9 METS
Exercise duration (sec): 30 s
Percent HR: 98 %
RPE: 17

## 2016-01-18 ENCOUNTER — Ambulatory Visit: Payer: Medicare Other | Admitting: Interventional Cardiology

## 2016-02-05 ENCOUNTER — Telehealth: Payer: Self-pay | Admitting: Interventional Cardiology

## 2016-02-05 NOTE — Telephone Encounter (Signed)
Mr. Shaun Fleming is calling to get test results . Please call .  Thanks

## 2016-02-05 NOTE — Telephone Encounter (Signed)
After several call  attempts to reach the patient , he called back after receiving a letter for him to call the office.   Pt made aware of gxt results and Dr.Smith's recommendation. abnormal ECG appearance and could be false positive. Please perform coronary CTA to be read by Dr. Eden EmmsNishan or Delton SeeNelson.  Pt sts that he would like to know the out of pocket cost of the coronary CTA before proceeding. Adv pt that we pre-cert to make sure the test is covered by insurance, he would need to contact his insurance to find out how much it would cost him, he can call back to schedule if he decides to proceed.  Adv pt that I will talk with billing to find out the CPT code for him to provide to his insurance. Pt voiced appreciation for the call back.

## 2016-02-06 NOTE — Telephone Encounter (Signed)
Called pt after speaking with our billing dept to provide him the CPT code for a Cardiac CT (CPT [phone]).  Pt will call his insurance company to find out his out of post cost. He will call the office when he is ready to schedule. Pt voiced appreciation for the call

## 2016-05-06 DIAGNOSIS — Z981 Arthrodesis status: Secondary | ICD-10-CM | POA: Diagnosis not present

## 2016-05-06 DIAGNOSIS — Z0000 Encounter for general adult medical examination without abnormal findings: Secondary | ICD-10-CM | POA: Diagnosis not present

## 2016-05-06 DIAGNOSIS — Z1159 Encounter for screening for other viral diseases: Secondary | ICD-10-CM | POA: Diagnosis not present

## 2016-05-06 DIAGNOSIS — Z79899 Other long term (current) drug therapy: Secondary | ICD-10-CM | POA: Diagnosis not present

## 2016-05-06 DIAGNOSIS — E785 Hyperlipidemia, unspecified: Secondary | ICD-10-CM | POA: Diagnosis not present

## 2016-05-06 DIAGNOSIS — M5416 Radiculopathy, lumbar region: Secondary | ICD-10-CM | POA: Diagnosis not present

## 2016-05-07 DIAGNOSIS — Z1159 Encounter for screening for other viral diseases: Secondary | ICD-10-CM | POA: Diagnosis not present

## 2016-05-07 DIAGNOSIS — Z125 Encounter for screening for malignant neoplasm of prostate: Secondary | ICD-10-CM | POA: Diagnosis not present

## 2016-05-07 DIAGNOSIS — Z79899 Other long term (current) drug therapy: Secondary | ICD-10-CM | POA: Diagnosis not present

## 2016-05-07 DIAGNOSIS — E785 Hyperlipidemia, unspecified: Secondary | ICD-10-CM | POA: Diagnosis not present

## 2016-05-07 DIAGNOSIS — Z0000 Encounter for general adult medical examination without abnormal findings: Secondary | ICD-10-CM | POA: Diagnosis not present

## 2016-05-15 DIAGNOSIS — R972 Elevated prostate specific antigen [PSA]: Secondary | ICD-10-CM | POA: Diagnosis not present

## 2016-10-08 DIAGNOSIS — R109 Unspecified abdominal pain: Secondary | ICD-10-CM | POA: Diagnosis not present

## 2017-04-22 DIAGNOSIS — N390 Urinary tract infection, site not specified: Secondary | ICD-10-CM | POA: Diagnosis not present

## 2017-04-22 DIAGNOSIS — N486 Induration penis plastica: Secondary | ICD-10-CM | POA: Diagnosis not present

## 2017-04-22 DIAGNOSIS — K146 Glossodynia: Secondary | ICD-10-CM | POA: Diagnosis not present

## 2017-05-27 DIAGNOSIS — R438 Other disturbances of smell and taste: Secondary | ICD-10-CM | POA: Diagnosis not present

## 2017-05-27 DIAGNOSIS — Z0000 Encounter for general adult medical examination without abnormal findings: Secondary | ICD-10-CM | POA: Diagnosis not present

## 2017-05-27 DIAGNOSIS — J343 Hypertrophy of nasal turbinates: Secondary | ICD-10-CM | POA: Diagnosis not present

## 2017-05-27 DIAGNOSIS — Z1211 Encounter for screening for malignant neoplasm of colon: Secondary | ICD-10-CM | POA: Diagnosis not present

## 2017-05-27 DIAGNOSIS — J342 Deviated nasal septum: Secondary | ICD-10-CM | POA: Diagnosis not present

## 2017-05-27 DIAGNOSIS — E785 Hyperlipidemia, unspecified: Secondary | ICD-10-CM | POA: Diagnosis not present

## 2017-05-27 DIAGNOSIS — R079 Chest pain, unspecified: Secondary | ICD-10-CM | POA: Diagnosis not present

## 2017-05-27 DIAGNOSIS — Z125 Encounter for screening for malignant neoplasm of prostate: Secondary | ICD-10-CM | POA: Diagnosis not present

## 2017-05-27 DIAGNOSIS — D509 Iron deficiency anemia, unspecified: Secondary | ICD-10-CM | POA: Diagnosis not present

## 2017-05-27 DIAGNOSIS — R1011 Right upper quadrant pain: Secondary | ICD-10-CM | POA: Diagnosis not present

## 2017-05-27 DIAGNOSIS — J310 Chronic rhinitis: Secondary | ICD-10-CM | POA: Diagnosis not present

## 2017-06-12 ENCOUNTER — Other Ambulatory Visit: Payer: Self-pay | Admitting: Family Medicine

## 2017-06-12 DIAGNOSIS — R1011 Right upper quadrant pain: Secondary | ICD-10-CM

## 2017-06-19 ENCOUNTER — Ambulatory Visit
Admission: RE | Admit: 2017-06-19 | Discharge: 2017-06-19 | Disposition: A | Discharge status: Home / Self Care | Payer: 59 | Source: Ambulatory Visit | Attending: Family Medicine | Admitting: Family Medicine

## 2017-06-19 DIAGNOSIS — R1011 Right upper quadrant pain: Secondary | ICD-10-CM

## 2017-06-19 DIAGNOSIS — R1010 Upper abdominal pain, unspecified: Secondary | ICD-10-CM | POA: Diagnosis not present

## 2017-07-10 ENCOUNTER — Ambulatory Visit: Payer: 59 | Admitting: Interventional Cardiology

## 2017-07-31 DIAGNOSIS — E7800 Pure hypercholesterolemia, unspecified: Secondary | ICD-10-CM | POA: Diagnosis not present

## 2017-11-15 IMAGING — RF DG C-ARM 61-120 MIN
1 series · 3 of 3 positions shown · non-contrast
Comparison: [id number]

CLINICAL DATA: L3-4 MAS PLIF

EXAM:
DG C-ARM 61-120 MIN; LUMBAR SPINE - 2-3 VIEW

[Series 1: run · 3 of 3 slices shown]
[im 1/3]
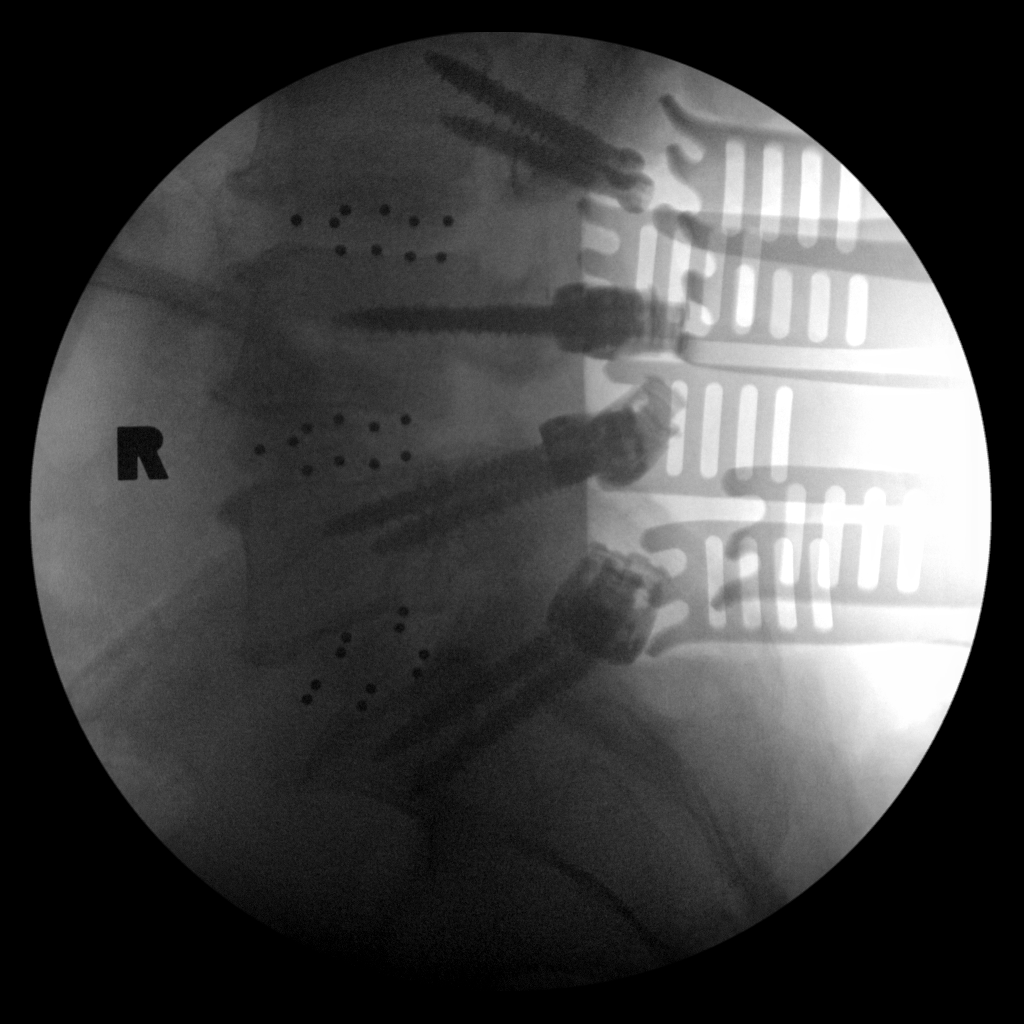
[im 2/3]
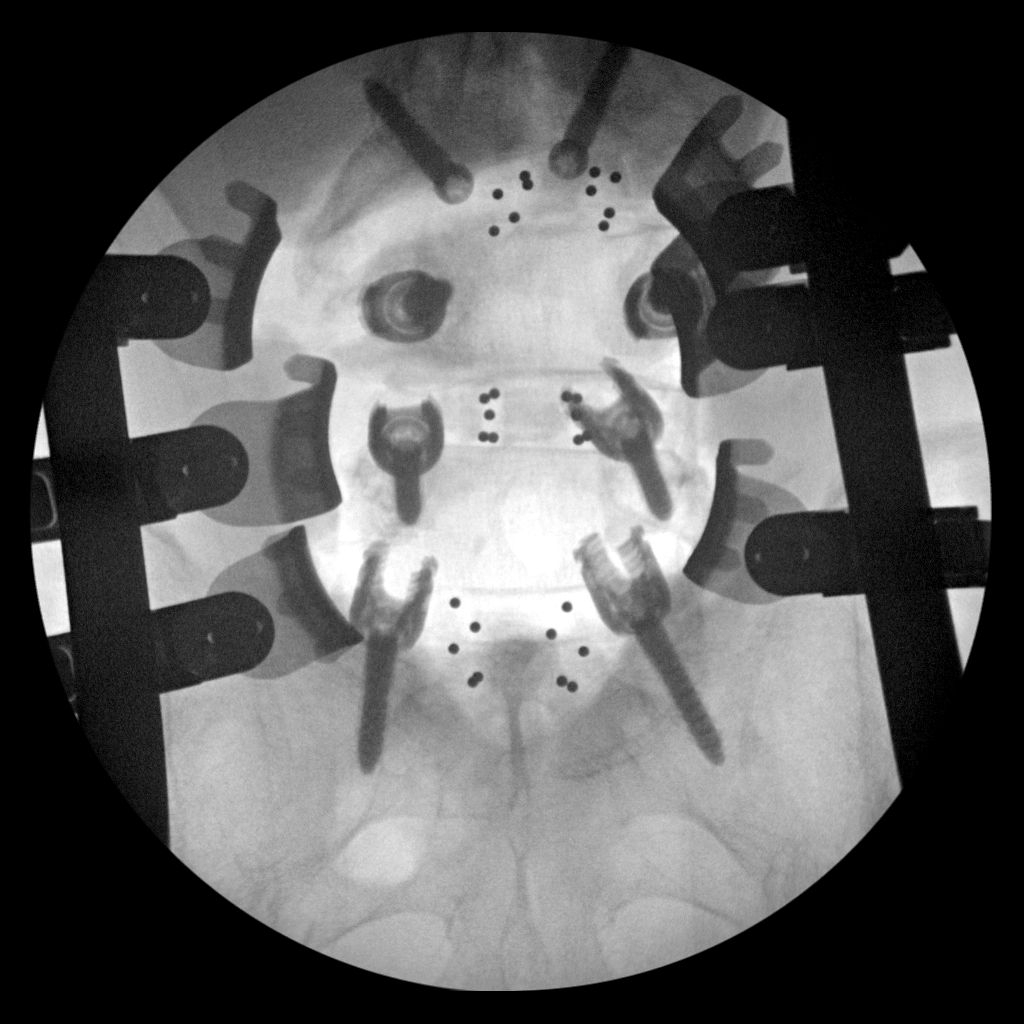
[im 3/3]
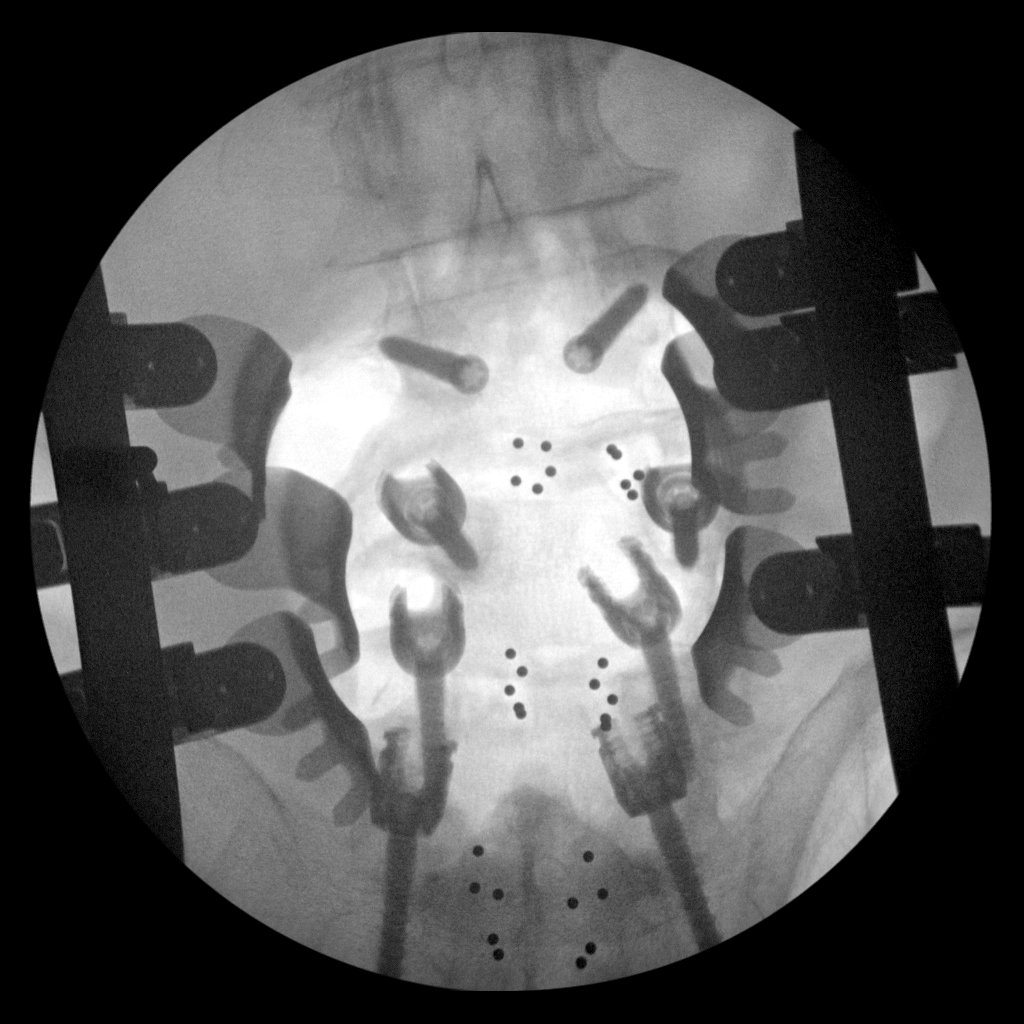

[3 of 3 positions shown; findings below may reference images not displayed]

FINDINGS: Fluoroscopy for L3-4, L4-5, L5-S1 PLIF. Intervertebral grafts appear
normally positioned. No indication of extra osseous screw placement.
No visualized intraoperative fracture.
IMPRESSION: Fluoroscopy for L3-4 to L5-S1 PLIF.

## 2017-12-30 ENCOUNTER — Ambulatory Visit
Admission: RE | Admit: 2017-12-30 | Discharge: 2017-12-30 | Disposition: A | Discharge status: Home / Self Care | Payer: Medicare PPO | Source: Ambulatory Visit | Attending: Family Medicine | Admitting: Family Medicine

## 2017-12-30 ENCOUNTER — Other Ambulatory Visit: Payer: Self-pay | Admitting: Family Medicine

## 2017-12-30 DIAGNOSIS — M2550 Pain in unspecified joint: Secondary | ICD-10-CM | POA: Diagnosis not present

## 2017-12-30 DIAGNOSIS — N529 Male erectile dysfunction, unspecified: Secondary | ICD-10-CM | POA: Diagnosis not present

## 2017-12-30 DIAGNOSIS — M25542 Pain in joints of left hand: Principal | ICD-10-CM

## 2017-12-30 DIAGNOSIS — M19041 Primary osteoarthritis, right hand: Secondary | ICD-10-CM | POA: Diagnosis not present

## 2017-12-30 DIAGNOSIS — M25541 Pain in joints of right hand: Secondary | ICD-10-CM

## 2017-12-30 DIAGNOSIS — M19042 Primary osteoarthritis, left hand: Secondary | ICD-10-CM | POA: Diagnosis not present

## 2017-12-30 DIAGNOSIS — R634 Abnormal weight loss: Secondary | ICD-10-CM | POA: Diagnosis not present

## 2017-12-30 DIAGNOSIS — M79673 Pain in unspecified foot: Secondary | ICD-10-CM | POA: Diagnosis not present

## 2017-12-30 DIAGNOSIS — E441 Mild protein-calorie malnutrition: Secondary | ICD-10-CM | POA: Diagnosis not present

## 2017-12-30 DIAGNOSIS — M79674 Pain in right toe(s): Secondary | ICD-10-CM | POA: Diagnosis not present

## 2018-03-17 DIAGNOSIS — M545 Low back pain: Secondary | ICD-10-CM | POA: Diagnosis not present

## 2018-03-17 DIAGNOSIS — R10811 Right upper quadrant abdominal tenderness: Secondary | ICD-10-CM | POA: Diagnosis not present

## 2018-03-23 DIAGNOSIS — Z1211 Encounter for screening for malignant neoplasm of colon: Secondary | ICD-10-CM | POA: Diagnosis not present

## 2018-03-23 DIAGNOSIS — K2950 Unspecified chronic gastritis without bleeding: Secondary | ICD-10-CM | POA: Diagnosis not present

## 2018-03-23 DIAGNOSIS — K3189 Other diseases of stomach and duodenum: Secondary | ICD-10-CM | POA: Diagnosis not present

## 2018-03-23 DIAGNOSIS — K2900 Acute gastritis without bleeding: Secondary | ICD-10-CM | POA: Diagnosis not present

## 2018-03-23 DIAGNOSIS — R10811 Right upper quadrant abdominal tenderness: Secondary | ICD-10-CM | POA: Diagnosis not present

## 2018-03-23 DIAGNOSIS — K317 Polyp of stomach and duodenum: Secondary | ICD-10-CM | POA: Diagnosis not present

## 2018-03-23 DIAGNOSIS — K219 Gastro-esophageal reflux disease without esophagitis: Secondary | ICD-10-CM | POA: Diagnosis not present

## 2018-03-23 DIAGNOSIS — B9681 Helicobacter pylori [H. pylori] as the cause of diseases classified elsewhere: Secondary | ICD-10-CM | POA: Diagnosis not present

## 2018-03-23 DIAGNOSIS — E7800 Pure hypercholesterolemia, unspecified: Secondary | ICD-10-CM | POA: Diagnosis not present

## 2018-03-23 DIAGNOSIS — R1010 Upper abdominal pain, unspecified: Secondary | ICD-10-CM | POA: Diagnosis not present

## 2018-04-06 ENCOUNTER — Ambulatory Visit: Payer: Medicare PPO | Admitting: Interventional Cardiology

## 2018-05-25 ENCOUNTER — Ambulatory Visit: Payer: Medicare PPO | Admitting: Interventional Cardiology

## 2018-08-26 ENCOUNTER — Ambulatory Visit: Payer: Medicare PPO | Admitting: Interventional Cardiology

## 2018-09-14 ENCOUNTER — Ambulatory Visit: Payer: Medicare PPO | Admitting: Interventional Cardiology

## 2018-10-23 ENCOUNTER — Telehealth: Payer: Self-pay | Admitting: Interventional Cardiology

## 2018-10-23 NOTE — Telephone Encounter (Signed)
Spoke with pt in regards to appt scheduled with Dr. Katrinka Blazing on 4/30.  Pt agreeable to virtual visit at 1230 but needs to confirm with wife.  Advised I will go ahead and schedule him into this slot and to call back if it was going to be a problem, otherwise someone will reach out next week with further details.

## 2018-10-29 ENCOUNTER — Telehealth: Payer: Medicare PPO | Admitting: Interventional Cardiology

## 2019-02-24 ENCOUNTER — Ambulatory Visit: Payer: Medicare PPO | Admitting: Interventional Cardiology

## 2020-06-17 IMAGING — CR DG HAND COMPLETE 3+V*R*
3 series · 3 of 3 positions shown · non-contrast
Comparison: None.

CLINICAL DATA: Chronic bilateral hand pain for months. No acute
injury.

EXAM:
RIGHT HAND - COMPLETE 3+ VIEW

[x hand pa right]
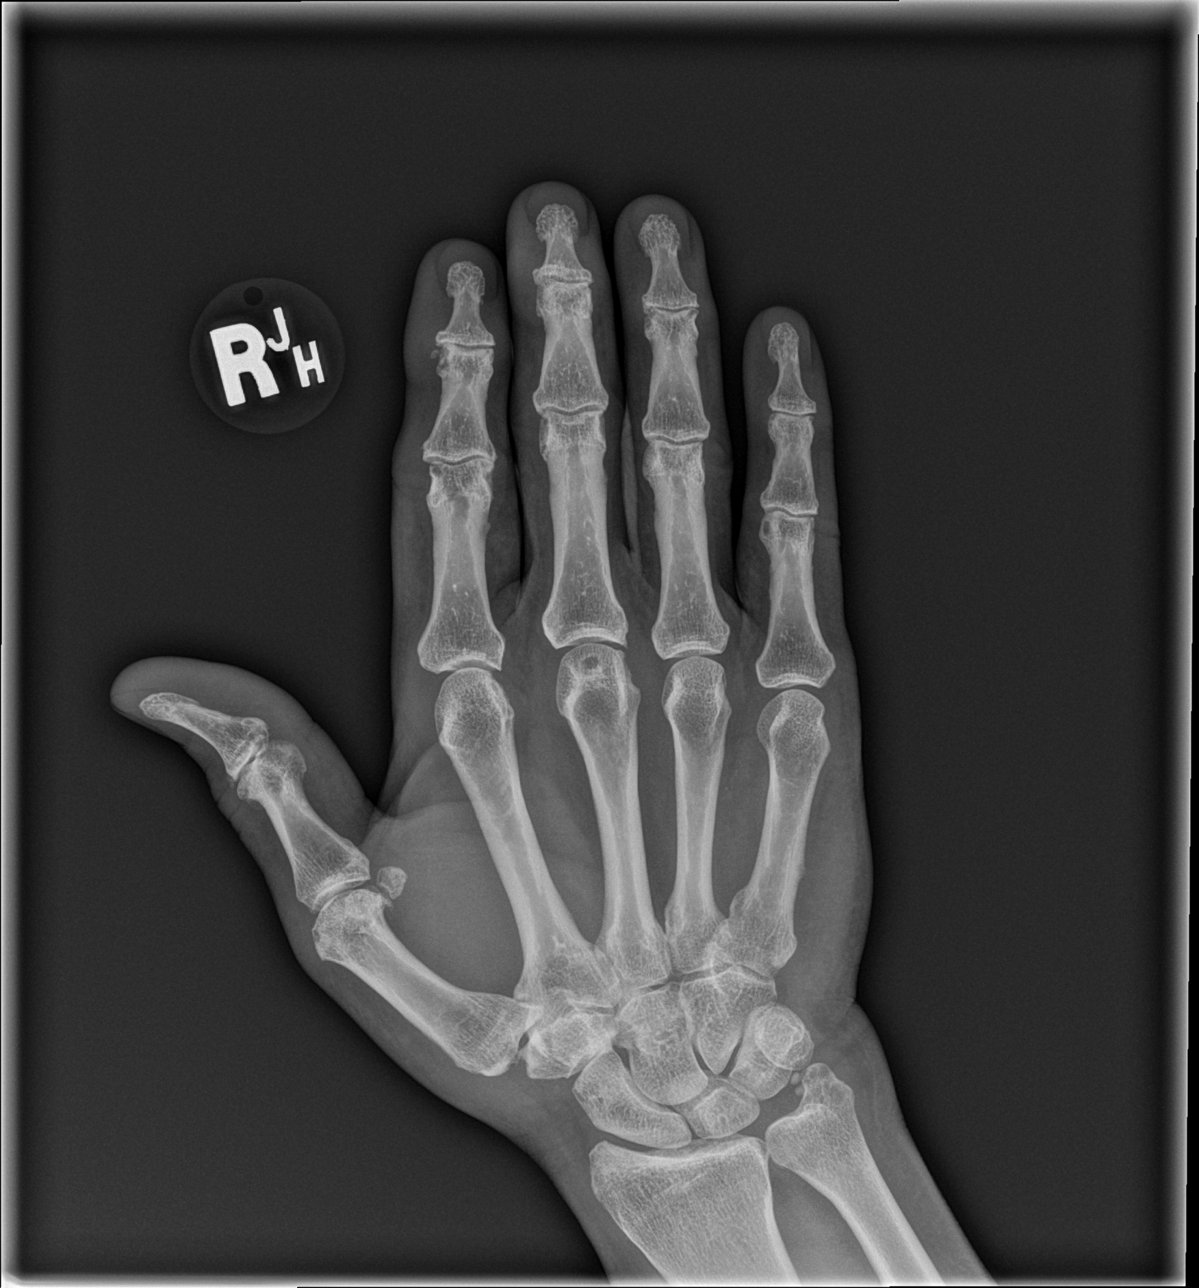

[x hand obl right]
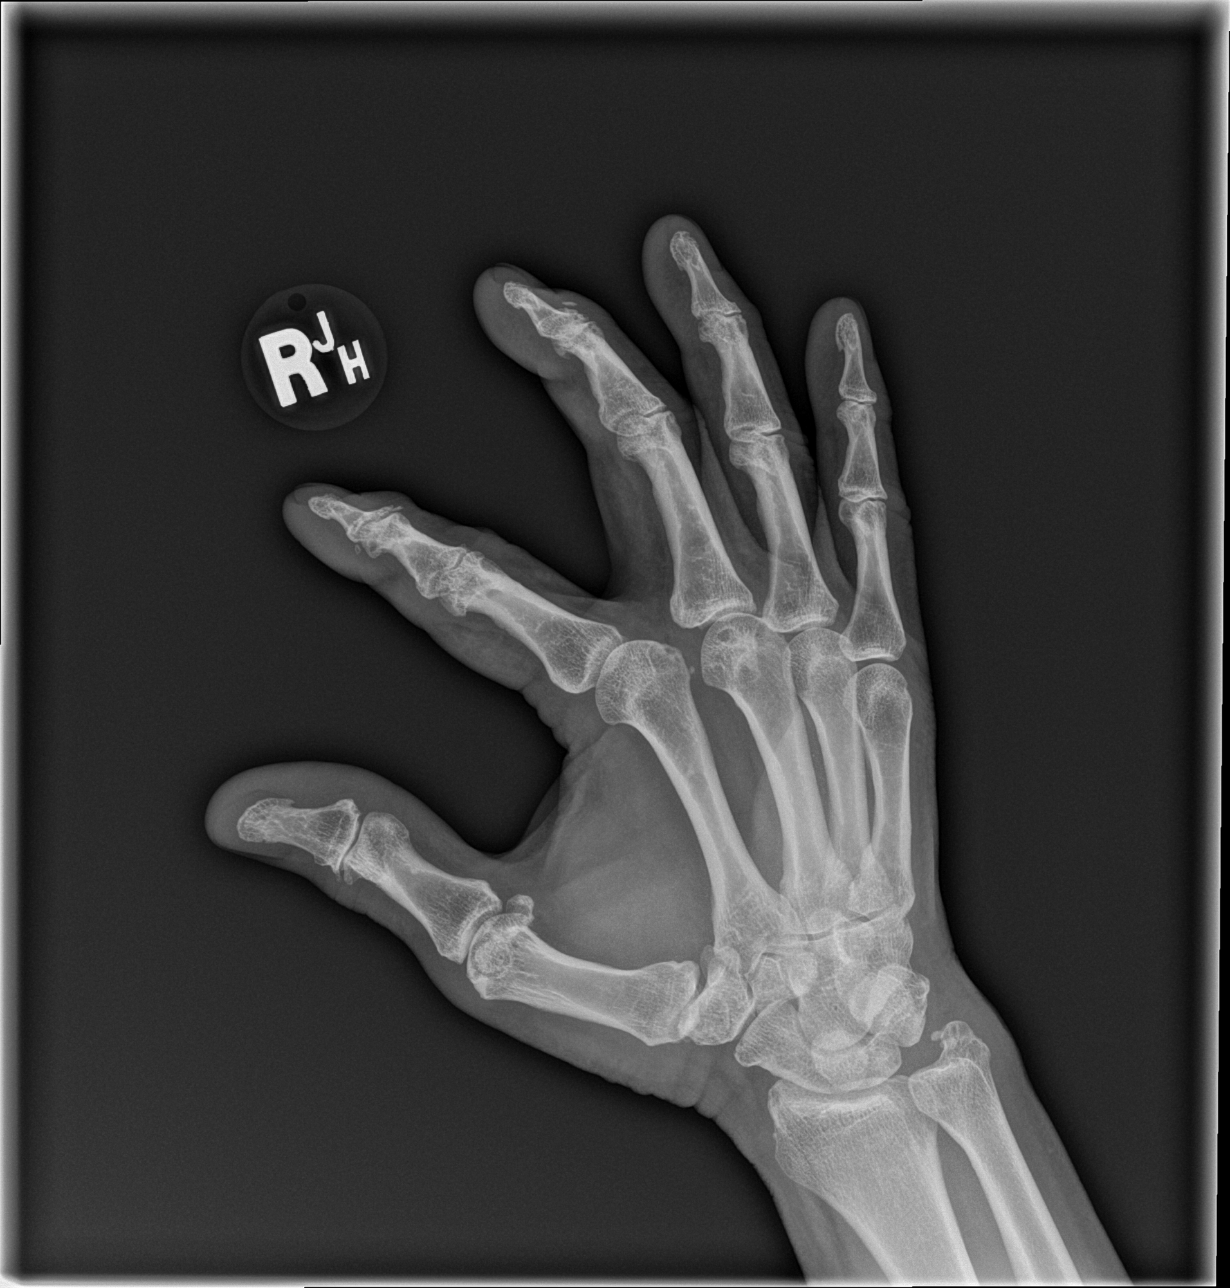

[x hand lat right]
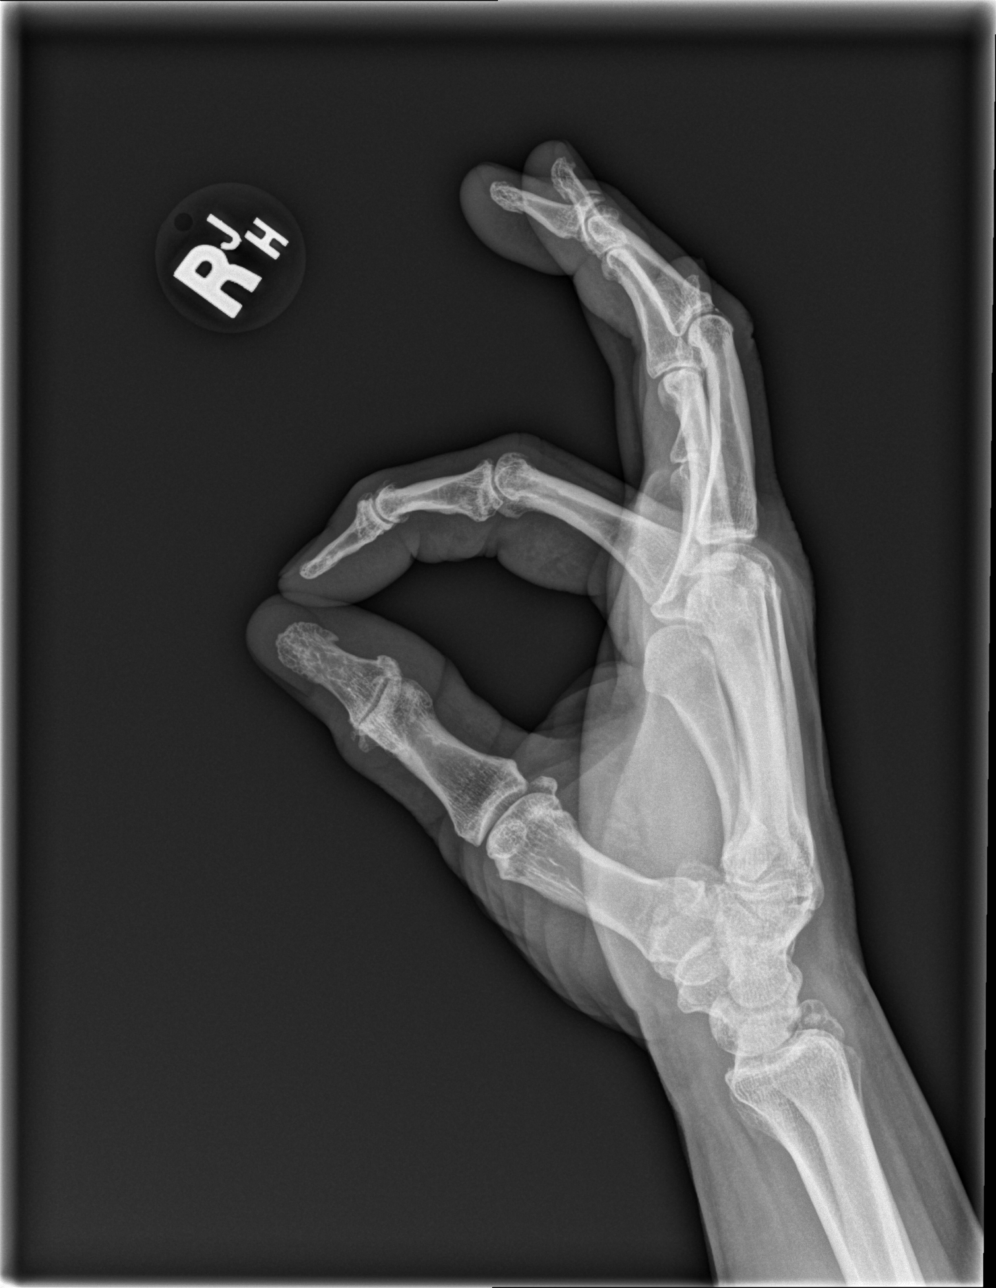

[3 of 3 positions shown; findings below may reference images not displayed]

FINDINGS: The mineralization and alignment are normal. There is no evidence of
acute fracture or dislocation. There is mild to moderate
interphalangeal joint space narrowing, greatest at the 2nd and 3rd
DIP joints. There are scattered subchondral cysts. Mild joint space
narrowing is present in the 1st through 3rd MCP joints. There are
dense calcifications adjacent to the ulnar styloid which could
reflect hydroxyapatite deposition. No erosive changes or focal soft
tissue abnormalities are identified.
IMPRESSION: Mild-to-moderate interphalangeal and metacarpal phalangeal
osteoarthritis. No acute osseous findings. Possible hydroxyapatite
deposition adjacent to the ulnar styloid.
